# Patient Record
Sex: Male | Born: 1940 | Race: White | Hispanic: No | Marital: Single | Smoking: Never smoker
Health system: Southern US, Community
[De-identification: ages and names within clinical notes are randomized; demographics above are authoritative.]

## PROBLEM LIST (undated history)

## (undated) DIAGNOSIS — E7800 Pure hypercholesterolemia, unspecified: Secondary | ICD-10-CM

## (undated) DIAGNOSIS — K769 Liver disease, unspecified: Secondary | ICD-10-CM

## (undated) DIAGNOSIS — I219 Acute myocardial infarction, unspecified: Secondary | ICD-10-CM

## (undated) DIAGNOSIS — S065X9A Traumatic subdural hemorrhage with loss of consciousness of unspecified duration, initial encounter: Secondary | ICD-10-CM

## (undated) DIAGNOSIS — I10 Essential (primary) hypertension: Secondary | ICD-10-CM

## (undated) DIAGNOSIS — S065XAA Traumatic subdural hemorrhage with loss of consciousness status unknown, initial encounter: Secondary | ICD-10-CM

## (undated) DIAGNOSIS — E119 Type 2 diabetes mellitus without complications: Secondary | ICD-10-CM

## (undated) HISTORY — PX: TONSILLECTOMY: SUR1361

---

## 2011-04-08 DIAGNOSIS — K769 Liver disease, unspecified: Secondary | ICD-10-CM | POA: Insufficient documentation

## 2014-02-16 ENCOUNTER — Emergency Department (HOSPITAL_COMMUNITY)
Admission: EM | Admit: 2014-02-16 | Discharge: 2014-02-16 | Disposition: A | Discharge status: Home / Self Care | Payer: 59 | Source: Home / Self Care | Attending: Family Medicine | Admitting: Family Medicine

## 2014-02-16 ENCOUNTER — Encounter (HOSPITAL_COMMUNITY): Payer: Self-pay | Admitting: *Deleted

## 2014-02-16 DIAGNOSIS — E139 Other specified diabetes mellitus without complications: Secondary | ICD-10-CM | POA: Diagnosis not present

## 2014-02-16 DIAGNOSIS — I10 Essential (primary) hypertension: Secondary | ICD-10-CM | POA: Diagnosis not present

## 2014-02-16 HISTORY — DX: Essential (primary) hypertension: I10

## 2014-02-16 HISTORY — DX: Liver disease, unspecified: K76.9

## 2014-02-16 HISTORY — DX: Pure hypercholesterolemia, unspecified: E78.00

## 2014-02-16 HISTORY — DX: Acute myocardial infarction, unspecified: I21.9

## 2014-02-16 HISTORY — DX: Type 2 diabetes mellitus without complications: E11.9

## 2014-02-16 LAB — POCT I-STAT, CHEM 8
BUN: 14 mg/dL (ref 6–23)
Calcium, Ion: 1.09 mmol/L — ABNORMAL LOW (ref 1.13–1.30)
Chloride: 94 mmol/L — ABNORMAL LOW (ref 96–112)
Creatinine, Ser: 1.1 mg/dL (ref 0.50–1.35)
Glucose, Bld: 323 mg/dL — ABNORMAL HIGH (ref 70–99)
HCT: 52 % (ref 39.0–52.0)
Hemoglobin: 17.7 g/dL — ABNORMAL HIGH (ref 13.0–17.0)
Potassium: 4.5 mmol/L (ref 3.5–5.1)
Sodium: 135 mmol/L (ref 135–145)
TCO2: 24 mmol/L (ref 0–100)

## 2014-02-16 MED ORDER — INSULIN ASPART 100 UNIT/ML ~~LOC~~ SOLN
25.0000 [IU] | Freq: Once | SUBCUTANEOUS | Status: AC
  Administered 2014-02-16: 25 [IU] via SUBCUTANEOUS

## 2014-02-16 MED ORDER — INSULIN ASPART 100 UNIT/ML ~~LOC~~ SOLN
SUBCUTANEOUS | Status: AC
  Filled 2014-02-16: qty 1

## 2014-02-16 NOTE — ED Notes (Signed)
Pt   Recently    Moved  To  [place]    To  Live  With   His   Brother       Due  To     Inability    To  Take  Care  Of  Himself  At  His  Prior     Residence          He  Has  No  Dr  In  [place]     But     His  Family  Is   Getting        Him  A  Doctor  Lined  Up

## 2014-02-16 NOTE — ED Provider Notes (Signed)
CSN: [id number]     Arrival date & time 02/16/14  [id number] History   First MD Initiated Contact with Patient 02/16/14 1010     Chief Complaint  Patient presents with  . Diabetes   (Consider location/radiation/quality/duration/timing/severity/associated sxs/prior Treatment) Patient is a 74 y.o. male presenting with diabetes problem. The history is provided by the patient.  Diabetes This is a chronic problem. The current episode started more than 2 days ago (here for eval, brought by brother from [place], living without heat, food, no money in trailer, not taking meds, losing wt, passing out). The problem has not changed since onset.Pertinent negatives include no chest pain and no abdominal pain.    Past Medical History  Diagnosis Date  . Diabetes mellitus without complication   . Liver damage   . Heart attack   . Hypertension   . Hypercholesteremia    Past Surgical History  Procedure Laterality Date  . Tonsillectomy     History reviewed. No pertinent family history. History  Substance Use Topics  . Smoking status: Never Smoker   . Smokeless tobacco: Not on file  . Alcohol Use: No    Review of Systems  Constitutional: Positive for appetite change and unexpected weight change. Negative for fever.  Respiratory: Negative.   Cardiovascular: Negative for chest pain and leg swelling.  Gastrointestinal: Negative.  Negative for abdominal pain.    Allergies  Review of patient's allergies indicates no known allergies.  Home Medications   Prior to Admission medications   Medication Sig Start Date End Date Taking? Authorizing Provider  atorvastatin (LIPITOR) 20 MG tablet Take 20 mg by mouth daily.   Yes Historical Provider, MD  fosinopril (MONOPRIL) 10 MG tablet Take 10 mg by mouth daily.   Yes Historical Provider, MD  insulin lispro (HUMALOG) 100 UNIT/ML injection Inject into the skin 3 (three) times daily before meals.   Yes Historical Provider, MD  metFORMIN (GLUCOPHAGE) 500 MG  tablet Take by mouth 2 (two) times daily with a meal.   Yes Historical Provider, MD   BP 130/72 mmHg  Pulse 72  Temp(Src) 98.6 F (37 C) (Oral)  Resp 18  SpO2 100% Physical Exam  Constitutional: He is oriented to person, place, and time. He appears well-developed and well-nourished.  Neck: Normal range of motion. Neck supple.  Cardiovascular: Normal rate, regular rhythm, normal heart sounds and intact distal pulses.   Pulmonary/Chest: Effort normal and breath sounds normal.  Abdominal: Soft. Bowel sounds are normal. There is no tenderness.  Lymphadenopathy:    He has no cervical adenopathy.  Neurological: He is alert and oriented to person, place, and time.  Skin: Skin is warm and dry.  Nursing note and vitals reviewed.   ED Course  Procedures (including critical care time) Labs Review Labs Reviewed  POCT I-STAT, CHEM 8 - Abnormal; Notable for the following:    Chloride 94 (*)    Glucose, Bld 323 (*)    Calcium, Ion 1.09 (*)    Hemoglobin 17.7 (*)    All other components within normal limits   i-stat bs 323. Imaging Review No results found.   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 Essential hypertension        Linna HoffJames D Athea Haley, MD 02/16/14 1047

## 2014-02-16 NOTE — Discharge Instructions (Signed)
Take 25 units of humalog for sugar over 250.Marland Kitchen. Continue your other medicines and see your doctor soon to establish care .

## 2014-02-19 ENCOUNTER — Emergency Department (HOSPITAL_COMMUNITY)
Admission: EM | Admit: 2014-02-19 | Discharge: 2014-02-20 | Disposition: A | Discharge status: Home / Self Care | Payer: Medicare PPO | Attending: Emergency Medicine | Admitting: Emergency Medicine

## 2014-02-19 ENCOUNTER — Encounter (HOSPITAL_COMMUNITY): Payer: Self-pay | Admitting: *Deleted

## 2014-02-19 DIAGNOSIS — E11649 Type 2 diabetes mellitus with hypoglycemia without coma: Secondary | ICD-10-CM | POA: Diagnosis present

## 2014-02-19 DIAGNOSIS — Z794 Long term (current) use of insulin: Secondary | ICD-10-CM | POA: Insufficient documentation

## 2014-02-19 DIAGNOSIS — E780 Pure hypercholesterolemia: Secondary | ICD-10-CM | POA: Diagnosis not present

## 2014-02-19 DIAGNOSIS — Z8719 Personal history of other diseases of the digestive system: Secondary | ICD-10-CM | POA: Diagnosis not present

## 2014-02-19 DIAGNOSIS — I252 Old myocardial infarction: Secondary | ICD-10-CM | POA: Insufficient documentation

## 2014-02-19 DIAGNOSIS — Z79899 Other long term (current) drug therapy: Secondary | ICD-10-CM | POA: Diagnosis not present

## 2014-02-19 DIAGNOSIS — Z880 Allergy status to penicillin: Secondary | ICD-10-CM | POA: Diagnosis not present

## 2014-02-19 DIAGNOSIS — E162 Hypoglycemia, unspecified: Secondary | ICD-10-CM

## 2014-02-19 DIAGNOSIS — I10 Essential (primary) hypertension: Secondary | ICD-10-CM | POA: Insufficient documentation

## 2014-02-19 LAB — I-STAT CHEM 8, ED
BUN: 25 mg/dL — AB (ref 6–23)
CALCIUM ION: 1.19 mmol/L (ref 1.13–1.30)
Chloride: 101 mmol/L (ref 96–112)
Creatinine, Ser: 1 mg/dL (ref 0.50–1.35)
Glucose, Bld: 89 mg/dL (ref 70–99)
HCT: 50 % (ref 39.0–52.0)
HEMOGLOBIN: 17 g/dL (ref 13.0–17.0)
Potassium: 4.5 mmol/L (ref 3.5–5.1)
SODIUM: 139 mmol/L (ref 135–145)
TCO2: 25 mmol/L (ref 0–100)

## 2014-02-19 LAB — CBG MONITORING, ED
GLUCOSE-CAPILLARY: 86 mg/dL (ref 70–99)
Glucose-Capillary: 90 mg/dL (ref 70–99)

## 2014-02-19 NOTE — Progress Notes (Signed)
CSW was notified by Nurse CM that pt's family was interested in ALF information. CSW gave family a list of assisted living facilities.  Trish MageBrittney Genever Hentges, LCSWA [phone] ED CSW 10:56 PM 02/19/2014

## 2014-02-19 NOTE — ED Notes (Signed)
Per [facility] - pt from home w/ acute onset of slurred speech approx 1930, fire department reports CBG of 48 on scene, pt given instant glucose and a sandwich and repeat CBG 69. [facility] reports a negative stroke screen on assessment - family reports slurred speech and symptoms appear to have resolved. Pt lives w/ his brother and brother's wife.

## 2014-02-19 NOTE — ED Notes (Signed)
Bed: [id number] Expected date: 02/19/14 Expected time: 7:51 PM Means of arrival: Ambulance Comments: hypoglycemia

## 2014-02-19 NOTE — Progress Notes (Signed)
  CARE MANAGEMENT ED NOTE 02/19/2014  Patient:  Morey HummingbirdDONNELLY,[place]   Account Number:  [id number]  Date Initiated:  02/19/2014  Documentation initiated by:  Radford PaxFERRERO,Donnavan Covault  Subjective/Objective Assessment:   Patient presents to Ed with acute onset of slurred speech, hypoglycemia     Subjective/Objective Assessment Detail:   Patient with pmhx of DM, HTN, heart attack, high cholesterol.     Action/Plan:   Action/Plan Detail:   Anticipated DC Date:       Status Recommendation to Physician:   Result of Recommendation:    Other ED Services  Consult Working Plan   In-house referral  Clinical Social Worker   DC Associate Professorlanning Services  Other  PCP issues    Choice offered to / List presented to:            Status of service:  Completed, signed off  ED Comments:   ED Comments Detail:  EDCM spoke to patient and patient's sister in law Dede QueryMarjorie Balderston at bedside via telephone.  Patient currently living with his brother and his sister in law in [place].  Patient moved to live with his family from [place] as he was unable to care for himself there, no heat, food, money, patient was noted to be losing weight and passing out.  Per patient's sister in law Ollen GrossMarjorie, they have a pcp for the patient here in [place], patient just cannot see him right now due to insurance reasons.  They are waiting for his insurance card to come in the mail. They have set up an apppointment with this new doctor for next week for the patient.  Per Ollen GrossMarjorie, they feel the patient needs to be in a facility so that he can recieve his medications correctly and be cared for 24/7.  Ollen GrossMarjorie is aware that patient needs Medicaid insurance to enter into a ALF and are in the process of contacting DSS to apply for Medicaid.   EDCM informed patient's sister in law if they  felt that patient needed home health services, the patient's pcp can arrange this with a social worker to assist with placement.  EDCM also informed Ollen GrossMarjorie that  patient's pcp can assist with placement as well.  Ollen GrossMarjorie agreeable to speak to EDSW.  [facility] to speak to patient and family.  Ollen GrossMarjorie reports she feels patient has enough medication "pills" to last but feels insulin is going to run out soon.  Ollen GrossMarjorie reports she has a print out of all of patient's medications including insulin but is unsure of how much the patient takes.  EDCM received phone number for [facility] [place], however, pharmacy was closed.  EDCM discussed medication concerns with EDRN and EDP.  No further EDCM needs at this time.

## 2014-02-20 MED ORDER — INSULIN LISPRO 100 UNIT/ML ~~LOC~~ SOLN: 25.0000 [IU] | SUBCUTANEOUS | Status: DC

## 2014-02-20 MED ORDER — GLUCOSE BLOOD VI STRP: ORAL_STRIP | Status: AC

## 2014-02-20 MED ORDER — DEXTROSE (DIABETIC USE) 40 % PO GEL: 15.0000 g | Freq: Once | ORAL | Status: DC

## 2014-02-20 NOTE — Discharge Instructions (Signed)
Low Blood Sugar °Low blood sugar (hypoglycemia) means that the level of sugar in your blood is lower than it should be. Signs of low blood sugar include: °· Getting sweaty. °· Feeling hungry. °· Feeling dizzy or weak. °· Feeling sleepier than normal. °· Feeling nervous. °· Headaches. °· Having a fast heartbeat. °Low blood sugar can happen fast and can be an emergency. Your doctor can do tests to check your blood sugar level. You can have low blood sugar and not have diabetes. °HOME CARE °· Check your blood sugar as told by your doctor. If it is less than 70 mg/dl or as told by your doctor, take 1 of the following: °¨ 3 to 4 glucose tablets. °¨ ½ cup clear juice. °¨ ½ cup soda pop, not diet. °¨ 1 cup milk. °¨ 5 to 6 hard candies. °· Recheck blood sugar after 15 minutes. Repeat until it is at the right level. °· Eat a snack if it is more than 1 hour until the next meal. °· Only take medicine as told by your doctor. °· Do not skip meals. Eat on time. °· Do not drink alcohol except with meals. °· Check your blood glucose before driving. °· Check your blood glucose before and after exercise. °· Always carry treatment with you, such as glucose pills. °· Always wear a medical alert bracelet if you have diabetes. °GET HELP RIGHT AWAY IF:  °· Your blood glucose goes below 70 mg/dl or as told by your doctor, and you: °¨ Are confused. °¨ Are not able to swallow. °¨ Pass out (faint). °· You cannot treat yourself. You may need someone to help you. °· You have low blood sugar problems often. °· You have problems from your medicines. °· You are not feeling better after 3 to 4 days. °· You have vision changes. °MAKE SURE YOU:  °· Understand these instructions. °· Will watch this condition. °· Will get help right away if you are not doing well or get worse. °Document Released: 03/18/2009 Document Revised: 03/16/2011 Document Reviewed: 03/18/2009 °ExitCare® Patient Information ©2015 [employer]. This information is not intended to  replace advice given to you by your health care provider. Make sure you discuss any questions you have with your health care provider. ° °

## 2014-03-05 NOTE — ED Provider Notes (Signed)
CSN: [id number]     Arrival date & time 02/19/14  2004 History   First MD Initiated Contact with Patient 02/19/14 2046     Chief Complaint  Patient presents with  . Hypoglycemia     (Consider location/radiation/quality/duration/timing/severity/associated sxs/prior Treatment) Patient is a 74 y.o. male presenting with hypoglycemia. The history is provided by the patient and the EMS personnel. No language interpreter was used.  Hypoglycemia Initial blood sugar:  48 Blood sugar after intervention:  69 Severity:  Severe Onset quality:  Unable to specify Timing:  Unable to specify Progression:  Resolved Chronicity:  Recurrent Diabetic status:  Controlled with insulin and controlled with oral medications Current diabetic therapy:  Lispro, metformin Context: decreased oral intake   Context comment:  Increased med use Relieved by:  Nothing Ineffective treatments:  None tried Associated symptoms: no dizziness, no shortness of breath, no speech difficulty, no vomiting and no weakness   Associated symptoms comment:  Slurred speech   Past Medical History  Diagnosis Date  . Diabetes mellitus without complication   . Liver damage   . Heart attack   . Hypertension   . Hypercholesteremia    Past Surgical History  Procedure Laterality Date  . Tonsillectomy     History reviewed. No pertinent family history. History  Substance Use Topics  . Smoking status: Never Smoker   . Smokeless tobacco: Not on file  . Alcohol Use: No    Review of Systems  Constitutional: Negative for fever, activity change, appetite change and fatigue.  HENT: Negative for congestion, facial swelling, rhinorrhea and trouble swallowing.   Eyes: Negative for photophobia and pain.  Respiratory: Negative for cough, chest tightness and shortness of breath.   Cardiovascular: Negative for chest pain and leg swelling.  Gastrointestinal: Negative for nausea, vomiting, abdominal pain, diarrhea and constipation.   Endocrine: Negative for polydipsia and polyuria.  Genitourinary: Negative for dysuria, urgency, decreased urine volume and difficulty urinating.  Musculoskeletal: Negative for back pain and gait problem.  Skin: Negative for color change, rash and wound.  Allergic/Immunologic: Negative for immunocompromised state.  Neurological: Negative for dizziness, facial asymmetry, speech difficulty, weakness, numbness and headaches.  Psychiatric/Behavioral: Negative for confusion, decreased concentration and agitation.      Allergies  Penicillins  Home Medications   Prior to Admission medications   Medication Sig Start Date End Date Taking? Authorizing Provider  atorvastatin (LIPITOR) 20 MG tablet Take 20 mg by mouth daily.   Yes Historical Provider, MD  fosinopril (MONOPRIL) 10 MG tablet Take 10 mg by mouth daily.   Yes Historical Provider, MD  metFORMIN (GLUCOPHAGE) 500 MG tablet Take by mouth 2 (two) times daily with a meal.   Yes Historical Provider, MD  dextrose (GLUTOSE) 40 % gel Take 15 g by mouth once. 02/20/14   Toy Cookey, MD  glucose blood (FREESTYLE TEST STRIPS) test strip Use as instructed 02/20/14   Toy Cookey, MD  insulin lispro (HUMALOG) 100 UNIT/ML injection Inject 0.25 mLs (25 Units total) into the skin every morning. 02/20/14   Toy Cookey, MD   BP 118/72 mmHg  Pulse 80  Temp(Src) 97.5 F (36.4 C) (Oral)  Resp 16  SpO2 100% Physical Exam  Constitutional: He is oriented to person, place, and time. He appears well-developed and well-nourished. No distress.  HENT:  Head: Normocephalic and atraumatic.  Mouth/Throat: No oropharyngeal exudate.  Eyes: Pupils are equal, round, and reactive to light.  Neck: Normal range of motion. Neck supple.  Cardiovascular: Normal rate, regular rhythm and  normal heart sounds.  Exam reveals no gallop and no friction rub.   No murmur heard. Pulmonary/Chest: Effort normal and breath sounds normal. No respiratory distress. He has no  wheezes. He has no rales.  Abdominal: Soft. Bowel sounds are normal. He exhibits no distension and no mass. There is no tenderness. There is no rebound and no guarding.  Musculoskeletal: Normal range of motion. He exhibits no edema or tenderness.  Neurological: He is alert and oriented to person, place, and time.  Skin: Skin is warm and dry.  Psychiatric: He has a normal mood and affect.    ED Course  Procedures (including critical care time) Labs Review Labs Reviewed  I-STAT CHEM 8, ED - Abnormal; Notable for the following:    BUN 25 (*)    All other components within normal limits  CBG MONITORING, ED  CBG MONITORING, ED  CBG MONITORING, ED    Imaging Review No results found.   EKG Interpretation None      MDM   Final diagnoses:  Hypoglycemia    Pt is a 74 y.o. male with Pmhx as above who presents with report of slurred speech and hypoglycemia initially of 48, improved after instant glucose and a sandwich, slurred speech resolved. Hx of same. On PE, VSS, pt in NAD, no focal neuro findings. . Pt observed in the ED for several hours, was able to maintain Glu, is not on a long acting agent. Will dec his daily lispro dose to what he was using prior to moving in with family. They will continue to keep glu log and f/u with PCP who is being set up.       Toy CookeyMegan Docherty, MD 03/05/14 [phone]

## 2014-03-27 ENCOUNTER — Ambulatory Visit: Payer: Medicare PPO | Admitting: Podiatry

## 2014-04-13 ENCOUNTER — Ambulatory Visit (INDEPENDENT_AMBULATORY_CARE_PROVIDER_SITE_OTHER): Payer: Medicare PPO | Admitting: Podiatry

## 2014-04-13 ENCOUNTER — Encounter: Payer: Self-pay | Admitting: Podiatry

## 2014-04-13 VITALS — BP 125/77 | HR 81 | Resp 18

## 2014-04-13 DIAGNOSIS — B351 Tinea unguium: Secondary | ICD-10-CM

## 2014-04-13 DIAGNOSIS — M79676 Pain in unspecified toe(s): Secondary | ICD-10-CM | POA: Diagnosis not present

## 2014-04-13 NOTE — Progress Notes (Signed)
   Subjective:    Patient ID: Mitchell Scott, male    DOB: 12/15/1940, 74 y.o.   MRN: [id number]  HPI  74 year old male presents the office they with complete a painful, thick, elongated toenails which she is unable to trim himself. He denies any drainage or redness around the nail sites. He denies any numbness or tingling this feeding denies any history of ulceration. No other complaints at this time.  Review of Systems  All other systems reviewed and are negative.      Objective:   Physical Exam AAO 3, NAD DP/PT pulses palpable, CRT less than 3 seconds Protective sensation intact with Dorann OuSimms Weinstein Monofilament, Achilles tendon reflex intact. Nails are hypertrophic, dystrophic, elongated, brittle, discolored 10. There subjective tenderness on nails 1-5 bilaterally. No swelling erythema or drainage. No open lesions or pre-ulcerative lesions identified bilaterally. There is no other areas of tenderness to bilateral lower extremities. There is no edema, erythema, increase in warmth bilaterally. No pain with calf compression, swelling, warmth, erythema.      Assessment & Plan:  74 year old male with symptomatic onychomycosis -Nail sharply debrided 10 without complications to breast bleeding -Discussed importance of daily foot inspection. -Follow-up in 3 months or sooner if any problems are to arise. In the meantime encouraged call the office any questions or concerns.

## 2014-04-16 ENCOUNTER — Encounter: Payer: Self-pay | Admitting: Podiatry

## 2014-07-27 ENCOUNTER — Ambulatory Visit: Payer: Medicare PPO

## 2018-07-03 ENCOUNTER — Emergency Department (HOSPITAL_COMMUNITY): Payer: Medicare Other

## 2018-07-03 ENCOUNTER — Encounter (HOSPITAL_COMMUNITY): Payer: Self-pay

## 2018-07-03 ENCOUNTER — Inpatient Hospital Stay (HOSPITAL_COMMUNITY)
Admission: EM | Admit: 2018-07-03 | Discharge: 2018-07-08 | DRG: 082 | Disposition: A | Discharge status: Skilled Nursing Facility | Payer: Medicare Other | Source: Skilled Nursing Facility | Attending: Internal Medicine | Admitting: Internal Medicine

## 2018-07-03 ENCOUNTER — Other Ambulatory Visit: Payer: Self-pay

## 2018-07-03 DIAGNOSIS — Z794 Long term (current) use of insulin: Secondary | ICD-10-CM

## 2018-07-03 DIAGNOSIS — Z66 Do not resuscitate: Secondary | ICD-10-CM | POA: Diagnosis present

## 2018-07-03 DIAGNOSIS — G9341 Metabolic encephalopathy: Secondary | ICD-10-CM | POA: Diagnosis not present

## 2018-07-03 DIAGNOSIS — U071 COVID-19: Secondary | ICD-10-CM | POA: Diagnosis present

## 2018-07-03 DIAGNOSIS — R296 Repeated falls: Secondary | ICD-10-CM | POA: Diagnosis present

## 2018-07-03 DIAGNOSIS — F0390 Unspecified dementia without behavioral disturbance: Secondary | ICD-10-CM | POA: Diagnosis present

## 2018-07-03 DIAGNOSIS — Y92129 Unspecified place in nursing home as the place of occurrence of the external cause: Secondary | ICD-10-CM

## 2018-07-03 DIAGNOSIS — E7800 Pure hypercholesterolemia, unspecified: Secondary | ICD-10-CM | POA: Diagnosis present

## 2018-07-03 DIAGNOSIS — D72829 Elevated white blood cell count, unspecified: Secondary | ICD-10-CM | POA: Diagnosis present

## 2018-07-03 DIAGNOSIS — W19XXXA Unspecified fall, initial encounter: Secondary | ICD-10-CM | POA: Diagnosis not present

## 2018-07-03 DIAGNOSIS — E1165 Type 2 diabetes mellitus with hyperglycemia: Secondary | ICD-10-CM | POA: Diagnosis present

## 2018-07-03 DIAGNOSIS — S065X9A Traumatic subdural hemorrhage with loss of consciousness of unspecified duration, initial encounter: Principal | ICD-10-CM | POA: Diagnosis present

## 2018-07-03 DIAGNOSIS — W1830XA Fall on same level, unspecified, initial encounter: Secondary | ICD-10-CM | POA: Diagnosis present

## 2018-07-03 DIAGNOSIS — I252 Old myocardial infarction: Secondary | ICD-10-CM

## 2018-07-03 DIAGNOSIS — E119 Type 2 diabetes mellitus without complications: Secondary | ICD-10-CM | POA: Diagnosis not present

## 2018-07-03 DIAGNOSIS — I2510 Atherosclerotic heart disease of native coronary artery without angina pectoris: Secondary | ICD-10-CM | POA: Diagnosis present

## 2018-07-03 DIAGNOSIS — S065XAA Traumatic subdural hemorrhage with loss of consciousness status unknown, initial encounter: Secondary | ICD-10-CM | POA: Diagnosis present

## 2018-07-03 DIAGNOSIS — I10 Essential (primary) hypertension: Secondary | ICD-10-CM | POA: Diagnosis present

## 2018-07-03 HISTORY — DX: Traumatic subdural hemorrhage with loss of consciousness status unknown, initial encounter: S06.5XAA

## 2018-07-03 HISTORY — DX: Traumatic subdural hemorrhage with loss of consciousness of unspecified duration, initial encounter: S06.5X9A

## 2018-07-03 LAB — COMPREHENSIVE METABOLIC PANEL
ALT: 26 U/L (ref 0–44)
AST: 26 U/L (ref 15–41)
Albumin: 3.7 g/dL (ref 3.5–5.0)
Alkaline Phosphatase: 111 U/L (ref 38–126)
Anion gap: 12 (ref 5–15)
BUN: 10 mg/dL (ref 8–23)
CO2: 26 mmol/L (ref 22–32)
Calcium: 9 mg/dL (ref 8.9–10.3)
Chloride: 98 mmol/L (ref 98–111)
Creatinine, Ser: 1.04 mg/dL (ref 0.61–1.24)
GFR calc Af Amer: >60 mL/min (ref >60)
GFR calc non Af Amer: >60 mL/min (ref >60)
Glucose, Bld: 337 mg/dL — ABNORMAL HIGH (ref 70–99)
Potassium: 3.6 mmol/L (ref 3.5–5.1)
Sodium: 136 mmol/L (ref 135–145)
Total Bilirubin: 0.8 mg/dL (ref 0.3–1.2)
Total Protein: 7 g/dL (ref 6.5–8.1)

## 2018-07-03 LAB — URINALYSIS, ROUTINE W REFLEX MICROSCOPIC
Bilirubin Urine: NEGATIVE
Glucose, UA: >=500 mg/dL — AB
Hgb urine dipstick: NEGATIVE
Ketones, ur: 5 mg/dL — AB
Leukocytes,Ua: NEGATIVE
Nitrite: NEGATIVE
Protein, ur: NEGATIVE mg/dL
Specific Gravity, Urine: 1.023 (ref 1.005–1.030)
pH: 5 (ref 5.0–8.0)

## 2018-07-03 LAB — CBC WITH DIFFERENTIAL/PLATELET
Abs Immature Granulocytes: 0.04 10*3/uL (ref 0.00–0.07)
Basophils Absolute: 0.1 10*3/uL (ref 0.0–0.1)
Basophils Relative: 1 %
Eosinophils Absolute: 0.7 10*3/uL — ABNORMAL HIGH (ref 0.0–0.5)
Eosinophils Relative: 6 %
HCT: 41.6 % (ref 39.0–52.0)
Hemoglobin: 13.2 g/dL (ref 13.0–17.0)
Immature Granulocytes: 0 %
Lymphocytes Relative: 12 %
Lymphs Abs: 1.3 10*3/uL (ref 0.7–4.0)
MCH: 27 pg (ref 26.0–34.0)
MCHC: 31.7 g/dL (ref 30.0–36.0)
MCV: 85.2 fL (ref 80.0–100.0)
Monocytes Absolute: 1.1 10*3/uL — ABNORMAL HIGH (ref 0.1–1.0)
Monocytes Relative: 10 %
Neutro Abs: 8.1 10*3/uL — ABNORMAL HIGH (ref 1.7–7.7)
Neutrophils Relative %: 71 %
Platelets: 300 10*3/uL (ref 150–400)
RBC: 4.88 MIL/uL (ref 4.22–5.81)
RDW: 16.5 % — ABNORMAL HIGH (ref 11.5–15.5)
WBC: 11.4 10*3/uL — ABNORMAL HIGH (ref 4.0–10.5)
nRBC: 0 % (ref 0.0–0.2)

## 2018-07-03 LAB — CBG MONITORING, ED: Glucose-Capillary: 302 mg/dL — ABNORMAL HIGH (ref 70–99)

## 2018-07-03 LAB — SARS CORONAVIRUS 2 BY RT PCR (HOSPITAL ORDER, PERFORMED IN ~~LOC~~ HOSPITAL LAB): SARS Coronavirus 2: POSITIVE — AB

## 2018-07-03 MED ORDER — ONDANSETRON HCL 4 MG/2ML IJ SOLN: 4.0000 mg | Freq: Three times a day (TID) | INTRAMUSCULAR | Status: DC | PRN

## 2018-07-03 MED ORDER — HYDRALAZINE HCL 20 MG/ML IJ SOLN: 5.0000 mg | INTRAMUSCULAR | Status: DC | PRN

## 2018-07-03 MED ORDER — LEVALBUTEROL TARTRATE 45 MCG/ACT IN AERO
2.0000 | INHALATION_SPRAY | Freq: Four times a day (QID) | RESPIRATORY_TRACT | Status: DC | PRN
  Filled 2018-07-03: qty 15

## 2018-07-03 NOTE — ED Notes (Signed)
Attempted to call report

## 2018-07-03 NOTE — Consult Note (Signed)
Chief Complaint   Chief Complaint  Patient presents with  . Fall    HPI   Consult requested by: Dr Lockie Mola Reason for consult: acute on chronic SDH  HPI: Mahmud Gehm is a 78 y.o. 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 He underwent repeat head CT which showed slightly enlargement of known SDH. NSY consultation requested.  Patient is confused and unable to provide history. By report, at baseline he is oriented x3 and dependent for most ADLs. He is not on any blood thinning agents.  I am able to view imaging from prior ED visits at [facility] 06/05/2018 and 06/12/2018. SDH is evident on these films. Unable to review any notes from his prior hospital stay.   There are no active problems to display for this patient.  PMH: Past Medical History:  Diagnosis Date  . Diabetes mellitus without complication (HCC)   . Heart attack (HCC)   . Hypercholesteremia   . Hypertension   . Liver damage     PSH: Past Surgical History:  Procedure Laterality Date  . TONSILLECTOMY      (Not in a hospital admission)   SH: Social History   Tobacco Use  . Smoking status: Never Smoker  Substance Use Topics  . Alcohol use: No  . Drug use: No    MEDS: Prior to Admission medications   Medication Sig Start Date End Date Taking? Authorizing Provider  atorvastatin (LIPITOR) 20 MG tablet Take 20 mg by mouth daily.    [provider]  atorvastatin (LIPITOR) 20 MG tablet Take 20 mg by mouth. 12/15/13 12/15/14  [provider]  Blood Glucose Monitoring Suppl (ONE TOUCH ULTRA MINI) W/DEVICE KIT Frequency:   Dosage:NULL   KIT  Instructions:Use to check BS daily.  Note:Dx:250.00   Insulin Treated 02/01/12   [provider]  dextrose (GLUTOSE) 40 % gel Take 15 g by mouth once. 02/20/14   Toy Cookey, MD  fosinopril (MONOPRIL) 10 MG tablet Take 10 mg by  mouth daily.    [provider]  fosinopril (MONOPRIL) 10 MG tablet Take 10 mg by mouth. 12/15/13 12/15/14  [provider]  glucose blood (FREESTYLE TEST STRIPS) test strip Use as instructed 02/20/14   Toy Cookey, MD  glucose blood test strip Check BS daily. Dx: E11.9 03/07/14   [provider]  INS SYRINGE/NEEDLE 1CC/28G 28G X 1/2" 1 ML MISC 31 gage  To use with insulin injections daily. 03/27/13   [provider]  insulin lispro (HUMALOG) 100 UNIT/ML injection Inject 0.25 mLs (25 Units total) into the skin every morning. 02/20/14   Toy Cookey, MD  Insulin Syringe-Needle U-100 31G X 5/16" 1 ML MISC Use BID for Novolin injections and PRN SS  Novolog 10/16/13   [provider]  lidocaine (XYLOCAINE) 2 % jelly  03/16/14   [provider]  metFORMIN (GLUCOPHAGE) 500 MG tablet Take by mouth 2 (two) times daily with a meal.    [provider]  metFORMIN (GLUCOPHAGE) 500 MG tablet Take 500 mg by mouth. 12/15/13 12/15/14  [provider]  NOVOFINE PLUS 32G X 4 MM MISC See admin instructions. 03/21/14   [provider]  ReliOn Ultra Thin Lancets MISC Use to check BS three times daily. 03/03/10   [provider]    ALLERGY: Allergies  Allergen Reactions  . Codeine  Other reaction(s): GASTRIC UPSET  . Penicillins Itching and Rash    Other reaction(s): UNKNOWN    Social History   Tobacco Use  . Smoking status: Never Smoker  Substance Use Topics  . Alcohol use: No     History reviewed. No pertinent family history.   ROS   ROS altered, unable to obtain  Exam   Vitals:   07/03/18 1845 07/03/18 1915  BP: (!) 143/83 139/75  Pulse: 86 85  Resp: 19 11  Temp:    SpO2: 99% 99%   General appearance: elderly male, resting comfortably, left frontal hematoma GCS: 13 - E4V3M6 Eyes: No scleral injection Cardiovascular: Regular rate and rhythm without murmurs, rubs, gallops. No edema or  variciosities. Distal pulses normal. Pulmonary: Effort normal, non-labored breathing Musculoskeletal:     Muscle tone upper extremities: Normal    Muscle tone lower extremities: Normal    Motor exam: Upper Extremities Deltoid Bicep Tricep Grip  Right 5/5 5/5 5/5 5/5  Left 5/5 5/5 5/5 5/5   Lower Extremity IP Quad PF DF EHL  Right 5/5 5/5 5/5 5/5 5/5  Left 5/5 5/5 5/5 5/5 5/5   Neurological Mental Status:    - Patient is awake, oriented to self, DOB, location (hospital but not specifically [place]). No oriented to year (says 1920).    - Patient is unable to give a clear and coherent history.    - Follows commands. Difficulties following two step commands Cranial Nerves    - II: Visual Fields are full. PERRL    - III/IV/VI: EOMI without ptosis or diploplia.     - V: Facial sensation is grossly normal    - VII: Facial movement is symmetric.     - VIII: hearing is intact to voice    - X: Uvula elevates symmetrically    - XI: Shoulder shrug is symmetric.    - XII: tongue is midline without atrophy or fasciculations.  Sensory: Sensation grossly intact to LT No drift  Results - Imaging/Labs   Results for orders placed or performed during the hospital encounter of 07/03/18 (from the past 48 hour(s))  CBG monitoring, ED     Status: Abnormal   Collection Time: 07/03/18  5:58 PM  Result Value Ref Range   Glucose-Capillary 302 (H) 70 - 99 mg/dL   Comment 1 Notify RN    Comment 2 Document in Chart   CBC with Differential     Status: Abnormal   Collection Time: 07/03/18  6:00 PM  Result Value Ref Range   WBC 11.4 (H) 4.0 - 10.5 K/uL   RBC 4.88 4.22 - 5.81 MIL/uL   Hemoglobin 13.2 13.0 - 17.0 g/dL   HCT 16.1 09.6 - 04.5 %   MCV 85.2 80.0 - 100.0 fL   MCH 27.0 26.0 - 34.0 pg   MCHC 31.7 30.0 - 36.0 g/dL   RDW 40.9 (H) 81.1 - 91.4 %   Platelets 300 150 - 400 K/uL   nRBC 0.0 0.0 - 0.2 %   Neutrophils Relative % 71 %   Neutro Abs 8.1 (H) 1.7 - 7.7 K/uL   Lymphocytes Relative 12 %    Lymphs Abs 1.3 0.7 - 4.0 K/uL   Monocytes Relative 10 %   Monocytes Absolute 1.1 (H) 0.1 - 1.0 K/uL   Eosinophils Relative 6 %   Eosinophils Absolute 0.7 (H) 0.0 - 0.5 K/uL   Basophils Relative 1 %   Basophils Absolute 0.1 0.0 - 0.1 K/uL   Immature Granulocytes  0 %   Abs Immature Granulocytes 0.04 0.00 - 0.07 K/uL    Comment: Performed at [facility], [address] [place], [place] [address]  Comprehensive metabolic panel     Status: Abnormal   Collection Time: 07/03/18  6:00 PM  Result Value Ref Range   Sodium 136 135 - 145 mmol/L   Potassium 3.6 3.5 - 5.1 mmol/L   Chloride 98 98 - 111 mmol/L   CO2 26 22 - 32 mmol/L   Glucose, Bld 337 (H) 70 - 99 mg/dL   BUN 10 8 - 23 mg/dL   Creatinine, Ser 0.16 0.61 - 1.24 mg/dL   Calcium 9.0 8.9 - 01.0 mg/dL   Total Protein 7.0 6.5 - 8.1 g/dL   Albumin 3.7 3.5 - 5.0 g/dL   AST 26 15 - 41 U/L   ALT 26 0 - 44 U/L   Alkaline Phosphatase 111 38 - 126 U/L   Total Bilirubin 0.8 0.3 - 1.2 mg/dL   GFR calc non Af Amer >60 >60 mL/min   GFR calc Af Amer >60 >60 mL/min   Anion gap 12 5 - 15    Comment: Performed at [facility], [address] [place], [place] [address]  Urinalysis, Routine w reflex microscopic     Status: Abnormal   Collection Time: 07/03/18  7:24 PM  Result Value Ref Range   Color, Urine YELLOW YELLOW   APPearance HAZY (A) CLEAR   Specific Gravity, Urine 1.023 1.005 - 1.030   pH 5.0 5.0 - 8.0   Glucose, UA >=500 (A) NEGATIVE mg/dL   Hgb urine dipstick NEGATIVE NEGATIVE   Bilirubin Urine NEGATIVE NEGATIVE   Ketones, ur 5 (A) NEGATIVE mg/dL   Protein, ur NEGATIVE NEGATIVE mg/dL   Nitrite NEGATIVE NEGATIVE   Leukocytes,Ua NEGATIVE NEGATIVE   RBC / HPF 0-5 0 - 5 RBC/hpf   WBC, UA 0-5 0 - 5 WBC/hpf   Bacteria, UA RARE (A) NONE SEEN   Squamous Epithelial / LPF 0-5 0 - 5   Mucus PRESENT    Ca Oxalate Crys, UA PRESENT     Comment: Performed at [facility], [address] [place], [place] [address]     Ct Head Wo Contrast  Result Date: 07/03/2018 CLINICAL DATA:  Multiple falls, recent stroke EXAM: CT HEAD WITHOUT CONTRAST CT CERVICAL SPINE WITHOUT CONTRAST TECHNIQUE: Multidetector CT imaging of the head and cervical spine was performed following the standard protocol without intravenous contrast. Multiplanar CT image reconstructions of the cervical spine were also generated. COMPARISON:  06/12/2018, 06/05/2018 FINDINGS: CT HEAD FINDINGS Brain: There is a redemonstrated left hemispheric subdural hematoma, which has slightly enlarged compared to prior examination dated 06/12/2018, with a new, high attenuation internal components. This now measures approximately 8 mm in maximum coronal thickness, previously 6 mm. There is no significant change in approximately 3 mm left right midline shift. Underlying small-vessel white matter disease and global volume loss. Vascular: No hyperdense vessel or unexpected calcification. Skull: Normal. Negative for fracture or focal lesion. Sinuses/Orbits: No acute finding. Other: Soft tissue contusion of the left frontal scalp. CT CERVICAL SPINE FINDINGS Alignment: Normal. Skull base and vertebrae: No acute fracture. No primary bone lesion or focal pathologic process. Soft tissues and spinal canal: No prevertebral fluid or swelling. No visible canal hematoma. 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 Upper chest: Negative. Other: None. IMPRESSION: 1. There is a redemonstrated left hemispheric  subdural hematoma, which has slightly enlarged compared to prior examination dated 06/12/2018, with a new, high attenuation internal components. This now measures approximately 8 mm in maximum coronal thickness, previously 6 mm. There is no significant change in approximately 3 mm left right midline shift. Underlying small-vessel white matter disease and global volume loss. 2.  Soft tissue contusion of the left frontal scalp.  3. No fracture or static subluxation of the cervical spine. 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 Electronically Signed   By: Lauralyn Primes M.D.   On: 07/03/2018 18:30   Ct Cervical Spine Wo Contrast  Result Date: 07/03/2018 CLINICAL DATA:  Multiple falls, recent stroke EXAM: CT HEAD WITHOUT CONTRAST CT CERVICAL SPINE WITHOUT CONTRAST TECHNIQUE: Multidetector CT imaging of the head and cervical spine was performed following the standard protocol without intravenous contrast. Multiplanar CT image reconstructions of the cervical spine were also generated. COMPARISON:  06/12/2018, 06/05/2018 FINDINGS: CT HEAD FINDINGS Brain: There is a redemonstrated left hemispheric subdural hematoma, which has slightly enlarged compared to prior examination dated 06/12/2018, with a new, high attenuation internal components. This now measures approximately 8 mm in maximum coronal thickness, previously 6 mm. There is no significant change in approximately 3 mm left right midline shift. Underlying small-vessel white matter disease and global volume loss. Vascular: No hyperdense vessel or unexpected calcification. Skull: Normal. Negative for fracture or focal lesion. Sinuses/Orbits: No acute finding. Other: Soft tissue contusion of the left frontal scalp. CT CERVICAL SPINE FINDINGS Alignment: Normal. Skull base and vertebrae: No acute fracture. No primary bone lesion or focal pathologic process. Soft tissues and spinal canal: No prevertebral fluid or swelling. No visible canal hematoma. 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 Upper chest: Negative. Other: None. IMPRESSION: 1. There is a redemonstrated left hemispheric subdural hematoma, which has slightly enlarged compared to prior examination dated 06/12/2018, with a new, high attenuation internal components. This now measures approximately 8 mm in maximum coronal  thickness, previously 6 mm. There is no significant change in approximately 3 mm left right midline shift. Underlying small-vessel white matter disease and global volume loss. 2.  Soft tissue contusion of the left frontal scalp. 3. No fracture or static subluxation of the cervical spine. 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 Electronically Signed   By: Lauralyn Primes M.D.   On: 07/03/2018 18:30   Dg Chest Portable 1 View  Result Date: 07/03/2018 CLINICAL DATA:  Fall EXAM: PORTABLE CHEST 1 VIEW COMPARISON:  None. FINDINGS: Cardiomegaly. Lungs clear. No effusions or edema. No acute bony abnormality. IMPRESSION: Cardiomegaly.  No active disease. Electronically Signed   By: Charlett Nose M.D.   On: 07/03/2018 19:30   Impression/Plan   78 y.o. male with known left hemispheric SDH without significant MLS who [facility]. He underwent repeat head CT which shows slight enlargement of SDH with acute component. No significant MLS. Although confused, he follows commands and has grossly normal strength in extremities. No indication for NS intervention.   Reviewed with Dr Newell Coral. Given history of known SDH, he has likely been followed by neurosurgeon in [place]. Would rec outpt follow up with that neurosurgeon.  He will be admitted under [facility] for observation.  - Rec repeat head CT tomorrow afternoon for monitoring - Neuro check q 2 hour. Report any change  Cindra Presume, [facility]

## 2018-07-03 NOTE — ED Notes (Signed)
Pt constantly pulling out iv and cords. No recollection of anything occuring.

## 2018-07-03 NOTE — ED Triage Notes (Addendum)
Pt arrived to ed via [facility] after a second fall today. First fall at approximately 1400 today with second fall at 1700. After second fall pt unable to follow commands. Pt baseline is AOx4. EMS reports GCS of 10.

## 2018-07-03 NOTE — ED Notes (Signed)
Pt's CBG result was 302. Informed Deneise Lever - RN.

## 2018-07-03 NOTE — H&P (Addendum)
History and Physical    Mitchell Scott NPY:[id number] DOB: 06/06/1940 DOA: 07/03/2018  Referring MD/NP/PA:   PCP: System, Pcp Not In   Patient coming from:  The patient is coming from SNF.  At baseline, pt is dependent for most of ADL.        Chief Complaint: fall, head injury, AMS  HPI: Mitchell Scott is a 78 y.o. male with medical history significant of recent SDH (02/2018), hypertension, hyperlipidemia, diabetes mellitus, CAD, who presents with fall, head injury and AMS.  Patient has AMS, not following command and is unable to provide accurate medical history, therefore, most of the history is obtained by discussing the case with ED physician, per EMS report, and with the nursing staff.  Per report, pt fell twice today, at 14:00 and 17:00. Not sure exactly what happened. Pt had head injury with left forehead hematoma.  He is confused, not following command.  He is reportedly oriented x3 at baseline.  Patient moves all extremities, no facial droop was or slurred speech.  No active cough, respiratory distress, nausea vomiting or diarrhea noted.  Not sure if patient has any chest pain or symptoms of UTI.  ED Course: pt was found to have WBC 11.4, negative urinalysis, pending COVID-19 test, electrolytes renal function okay, temperature 97.2, blood pressure 139/75, heart rate 103, 85, no tachypnea, oxygen saturation 97-99% on room air.  Chest x-ray showed cardiomegaly without infiltration.  CT of C-spine is negative for bony fracture, but showed a degenerative disc disease. CT-head showed left SDH which is slightly enlarged compared to recent image 6/7, no significant change in approximately 3 mm left right midline shift.  Patient is placed on stepdown bed for observation. Neurosurgeon, Dr. Sherwood Gambler was consulted.  # CT-head: There is a redemonstrated left hemispheric subdural hematoma, which has slightly enlarged compared to prior examination dated 06/12/2018, with a new, high attenuation internal  components. This now measures approximately 8 mm in maximum coronal thickness, previously 6 mm. There is no significant change in approximately 3 mm left right midline shift. Underlying small-vessel white matter disease and global volume loss.  Review of Systems: Could not reviewed due to altered mental status.  Allergy:  Allergies  Allergen Reactions   Codeine     Other reaction(s): GASTRIC UPSET   Penicillins Itching and Rash    Other reaction(s): UNKNOWN    Past Medical History:  Diagnosis Date   Diabetes mellitus without complication ([place])    Heart attack ([place])    Hypercholesteremia    Hypertension    Liver damage    SDH (subdural hematoma) (H[facility])     Past Surgical History:  Procedure Laterality Date   TONSILLECTOMY      Social History:  reports that he has never smoked. He does not have any smokeless tobacco history on file. He reports that he does not drink alcohol or use drugs.  Family History: could not be reviewed due to altered mental status.  Prior to Admission medications   Medication Sig Start Date End Date Taking? Authorizing Provider  atorvastatin (LIPITOR) 20 MG tablet Take 20 mg by mouth daily.    [provider]  atorvastatin (LIPITOR) 20 MG tablet Take 20 mg by mouth. 12/15/13 12/15/14  [provider]  Blood Glucose Monitoring Suppl (ONE TOUCH ULTRA MINI) W/DEVICE KIT Frequency:   Dosage:   KIT  Instructions:Use to check BS daily.  Note:Dx:250.00   Insulin Treated 02/01/12   [provider]  dextrose (GLUTOSE) 40 % gel Take 15  g by mouth once. 02/20/14   Ernestina Patches, MD  fosinopril (MONOPRIL) 10 MG tablet Take 10 mg by mouth daily.    [provider]  fosinopril (MONOPRIL) 10 MG tablet Take 10 mg by mouth. 12/15/13 12/15/14  [provider]  glucose blood (FREESTYLE TEST STRIPS) test strip Use as instructed 02/20/14   Ernestina Patches, MD  glucose blood test strip Check BS daily. Dx: E11.9 03/07/14    [provider]  INS SYRINGE/NEEDLE 1CC/28G 28G X 1/2" 1 ML MISC 31 gage  To use with insulin injections daily. 03/27/13   [provider]  insulin lispro (HUMALOG) 100 UNIT/ML injection Inject 0.25 mLs (25 Units total) into the skin every morning. 02/20/14   Ernestina Patches, MD  Insulin Syringe-Needle U-100 31G X 5/16" 1 ML MISC Use BID for Novolin injections and PRN SS  Novolog 10/16/13   [provider]  lidocaine (XYLOCAINE) 2 % jelly  03/16/14   [provider]  metFORMIN (GLUCOPHAGE) 500 MG tablet Take by mouth 2 (two) times daily with a meal.    [provider]  metFORMIN (GLUCOPHAGE) 500 MG tablet Take 500 mg by mouth. 12/15/13 12/15/14  [provider]  NOVOFINE PLUS 32G X 4 MM MISC See admin instructions. 03/21/14   [provider]  ReliOn Ultra Thin Lancets MISC Use to check BS three times daily. 03/03/10   [provider]    Physical Exam: Vitals:   07/03/18 2002 07/03/18 2045 07/03/18 2100 07/03/18 2200  BP: (!) 153/100   135/85  Pulse: 94     Resp: 19     Temp:      TempSrc:      SpO2: 100% 92% 99%    General: Not in acute distress HEENT: has left forehead hematoma       Eyes: PERRL, EOMI, no scleral icterus.       ENT: No discharge from the ears and nose, no pharynx injection, no tonsillar enlargement.        Neck: No JVD, no bruit, no mass felt. Heme: No neck lymph node enlargement. Cardiac: S1/S2, RRR, No murmurs, No gallops or rubs. Respiratory: No rales, wheezing, rhonchi or rubs. GI: Soft, nondistended, nontender, no organomegaly, BS present. GU: No hematuria Ext: No pitting leg edema bilaterally. 2+DP/PT pulse bilaterally. Musculoskeletal: No joint deformities, No joint redness or warmth, no limitation of ROM in spin. Skin: seems have scabies abdominal and groin area Neuro: confused, not following commands, not  oriented X3, cranial nerves II-XII grossly intact, moves all extremities. Psych:  Patient is not psychotic.  Labs on Admission: I have personally reviewed following labs and imaging studies  CBC: Recent Labs  Lab 07/03/18 1800  WBC 11.4*  NEUTROABS 8.1*  HGB 13.2  HCT 41.6  MCV 85.2  PLT 161   Basic Metabolic Panel: Recent Labs  Lab 07/03/18 1800  NA 136  K 3.6  CL 98  CO2 26  GLUCOSE 337*  BUN 10  CREATININE 1.04  CALCIUM 9.0   GFR: CrCl cannot be calculated (Unknown ideal weight.). Liver Function Tests: Recent Labs  Lab 07/03/18 1800  AST 26  ALT 26  ALKPHOS 111  BILITOT 0.8  PROT 7.0  ALBUMIN 3.7   No results for input(s): LIPASE, AMYLASE in the last 168 hours. No results for input(s): AMMONIA in the last 168 hours. Coagulation Profile: No results for input(s): INR, PROTIME in the last 168 hours. Cardiac Enzymes: No results for input(s): CKTOTAL, CKMB, CKMBINDEX, TROPONINI in  the last 168 hours. BNP (last 3 results) No results for input(s): PROBNP in the last 8760 hours. HbA1C: No results for input(s): HGBA1C in the last 72 hours. CBG: Recent Labs  Lab 07/03/18 1758  GLUCAP 302*   Lipid Profile: No results for input(s): CHOL, HDL, LDLCALC, TRIG, CHOLHDL, LDLDIRECT in the last 72 hours. Thyroid Function Tests: No results for input(s): TSH, T4TOTAL, FREET4, T3FREE, THYROIDAB in the last 72 hours. Anemia Panel: No results for input(s): VITAMINB12, FOLATE, FERRITIN, TIBC, IRON, RETICCTPCT in the last 72 hours. Urine analysis:    Component Value Date/Time   COLORURINE YELLOW 07/03/2018 1924   APPEARANCEUR HAZY (A) 07/03/2018 1924   LABSPEC 1.023 07/03/2018 1924   PHURINE 5.0 07/03/2018 1924   GLUCOSEU >=500 (A) 07/03/2018 [place] NEGATIVE 07/03/2018 [place] NEGATIVE 07/03/2018 1924   KETONESUR 5 (A) 07/03/2018 1924   PROTEINUR NEGATIVE 07/03/2018 1924   NITRITE NEGATIVE 07/03/2018 1924   LEUKOCYTESUR NEGATIVE 07/03/2018 1924   Sepsis Labs: '@LABRCNTIP' (procalcitonin:4,lacticidven:4) ) Recent Results (from  the past 240 hour(s))  SARS Coronavirus 2 (CEPHEID - Performed in [place] hospital lab), Hosp Order     Status: Abnormal   Collection Time: 07/03/18  7:50 PM   Specimen: Nasopharyngeal Swab  Result Value Ref Range Status   SARS Coronavirus 2 POSITIVE (A) NEGATIVE Final    Comment: RESULT CALLED TO, READ BACK BY AND VERIFIED WITH: RN P JACSON @ 2140 07/03/18 BY S GEZAHEGN  (NOTE) If result is NEGATIVE SARS-CoV-2 target nucleic acids are NOT DETECTED. The SARS-CoV-2 RNA is generally detectable in upper and lower  respiratory specimens during the acute phase of infection. The lowest  concentration of SARS-CoV-2 viral copies this assay can detect is 250  copies / mL. A negative result does not preclude SARS-CoV-2 infection  and should not be used as the sole basis for treatment or other  patient management decisions.  A negative result may occur with  improper specimen collection / handling, submission of specimen other  than nasopharyngeal swab, presence of viral mutation(s) within the  areas targeted by this assay, and inadequate number of viral copies  (<250 copies / mL). A negative result must be combined with clinical  observations, patient history, and epidemiological information. If result is POSITIVE SARS-CoV-2 target nucleic acids are DETECT ED. The SARS-CoV-2 RNA is generally detectable in upper and lower  respiratory specimens during the acute phase of infection.  Positive  results are indicative of active infection with SARS-CoV-2.  Clinical  correlation with patient history and other diagnostic information is  necessary to determine patient infection status.  Positive results do  not rule out bacterial infection or co-infection with other viruses. If result is PRESUMPTIVE POSTIVE SARS-CoV-2 nucleic acids MAY BE PRESENT.   A presumptive positive result was obtained on the submitted specimen  and confirmed on repeat testing.  While 2019 novel coronavirus  (SARS-CoV-2)  nucleic acids may be present in the submitted sample  additional confirmatory testing may be necessary for epidemiological  and / or clinical management purposes  to differentiate between  SARS-CoV-2 and other Sarbecovirus currently known to infect humans.  If clinically indicated additional testing with an alternate test  methodology (LAB74 53) is advised. The SARS-CoV-2 RNA is generally  detectable in upper and lower respiratory specimens during the acute  phase of infection. The expected result is Negative. Fact Sheet for Patients:  StrictlyIdeas.no Fact Sheet for Healthcare Providers: BankingDealers.co.za This test is not yet approved or cleared  by the [place] and has been authorized for detection and/or diagnosis of SARS-CoV-2 by FDA under an Emergency Use Authorization (EUA).  This EUA will remain in effect (meaning this test can be used) for the duration of the COVID-19 declaration under Section 564(b)(1) of the Act, 21 U.S.C. section 360bbb-3(b)(1), unless the authorization is terminated or revoked sooner. Performed at [facility], [place] [address], [place], [place] [address]      Radiological Exams on Admission: Ct Head Wo Contrast  Result Date: 07/03/2018 CLINICAL DATA:  Multiple falls, recent stroke EXAM: CT HEAD WITHOUT CONTRAST CT CERVICAL SPINE WITHOUT CONTRAST TECHNIQUE: Multidetector CT imaging of the head and cervical spine was performed following the standard protocol without intravenous contrast. Multiplanar CT image reconstructions of the cervical spine were also generated. COMPARISON:  06/12/2018, 06/05/2018 FINDINGS: CT HEAD FINDINGS Brain: There is a redemonstrated left hemispheric subdural hematoma, which has slightly enlarged compared to prior examination dated 06/12/2018, with a new, high attenuation internal components. This now measures approximately 8 mm in maximum coronal thickness, previously 6 mm.  There is no significant change in approximately 3 mm left right midline shift. Underlying small-vessel white matter disease and global volume loss. Vascular: No hyperdense vessel or unexpected calcification. Skull: Normal. Negative for fracture or focal lesion. Sinuses/Orbits: No acute finding. Other: Soft tissue contusion of the left frontal scalp. CT CERVICAL SPINE FINDINGS Alignment: Normal. Skull base and vertebrae: No acute fracture. No primary bone lesion or focal pathologic process. Soft tissues and spinal canal: No prevertebral fluid or swelling. No visible canal hematoma. 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 Upper chest: Negative. Other: None. IMPRESSION: 1. There is a redemonstrated left hemispheric subdural hematoma, which has slightly enlarged compared to prior examination dated 06/12/2018, with a new, high attenuation internal components. This now measures approximately 8 mm in maximum coronal thickness, previously 6 mm. There is no significant change in approximately 3 mm left right midline shift. Underlying small-vessel white matter disease and global volume loss. 2.  Soft tissue contusion of the left frontal scalp. 3. No fracture or static subluxation of the cervical spine. 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 Electronically Signed   By: Eddie Candle M.D.   On: 07/03/2018 18:30   Ct Cervical Spine Wo Contrast  Result Date: 07/03/2018 CLINICAL DATA:  Multiple falls, recent stroke EXAM: CT HEAD WITHOUT CONTRAST CT CERVICAL SPINE WITHOUT CONTRAST TECHNIQUE: Multidetector CT imaging of the head and cervical spine was performed following the standard protocol without intravenous contrast. Multiplanar CT image reconstructions of the cervical spine were also generated. COMPARISON:  06/12/2018, 06/05/2018 FINDINGS: CT HEAD FINDINGS Brain: There is a redemonstrated left hemispheric  subdural hematoma, which has slightly enlarged compared to prior examination dated 06/12/2018, with a new, high attenuation internal components. This now measures approximately 8 mm in maximum coronal thickness, previously 6 mm. There is no significant change in approximately 3 mm left right midline shift. Underlying small-vessel white matter disease and global volume loss. Vascular: No hyperdense vessel or unexpected calcification. Skull: Normal. Negative for fracture or focal lesion. Sinuses/Orbits: No acute finding. Other: Soft tissue contusion of the left frontal scalp. CT CERVICAL SPINE FINDINGS Alignment: Normal. Skull base and vertebrae: No acute fracture. No primary bone lesion or focal pathologic process. Soft tissues and spinal canal: No prevertebral fluid or swelling. No visible canal hematoma. Disc levels: Moderate to severe multilevel disc  and facet degenerative disease, worst at C3-C4 and C5-C6. Upper chest: Negative. Other: None. IMPRESSION: 1. There is a redemonstrated left hemispheric subdural hematoma, which has slightly enlarged compared to prior examination dated 06/12/2018, with a new, high attenuation internal components. This now measures approximately 8 mm in maximum coronal thickness, previously 6 mm. There is no significant change in approximately 3 mm left right midline shift. Underlying small-vessel white matter disease and global volume loss. 2.  Soft tissue contusion of the left frontal scalp. 3. No fracture or static subluxation of the cervical spine. 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 Electronically Signed   By: Eddie Candle M.D.   On: 07/03/2018 18:30   Dg Chest Portable 1 View  Result Date: 07/03/2018 CLINICAL DATA:  Fall EXAM: PORTABLE CHEST 1 VIEW COMPARISON:  None. FINDINGS: Cardiomegaly. Lungs clear. No effusions or edema. No acute bony abnormality. IMPRESSION: Cardiomegaly.  No active disease. Electronically Signed    By: Rolm Baptise M.D.   On: 07/03/2018 19:30     EKG: Independently reviewed.  Sinus rhythm, QTC 472, low voltage, right bundle blockage (no old EKG to compare)  Assessment/Plan Principal Problem:   SDH (subdural hematoma) ([facility]) Active Problems:   Hypertension   Hypercholesteremia   Diabetes mellitus without complication ([facility])   Fall   Leukocytosis   Acute metabolic encephalopathy   CAD (coronary artery disease)   COVID-19 virus infection   SDH (subdural hematoma) ([place]): pt has recent SDH 05/2018. Now present with fall and head injury. CT-head showed left SDH which is slightly enlarged compared to recent image 6/7, no significant change in approximately 3 mm left right midline shift. He has AMS.  Neurosurgeon, Dr. Astrid Drafts was consulted.  -will place on SDU for obs -Frequent neuro checks -Follow-up neurosurgeon's recommendation which is highly appreciated -repeat CT in AM  Fall: Not sure exactly what happened.  Per ED physician, patient seems to fell out of the wheelchair. EDP tried to contact pt's brother without success. -pt/ot when able to   Acute metabolic encephalopathy: Possibly due to SDH -Frequent neuro check -keep pt NPO until mental status improves -Hold oral medications until mental status improves  Leukocytosis: WBC 11.4.  Temperature 97.2.  Urinalysis negative.  Chest x-ray is negative for infiltration. -Follow-up of blood culture and urine culture  HTN:  -hole oral home medications: -IV hydralazine prn  Hypercholesteremia: -hold lipitor  Diabetes mellitus without complication ([place]): Last A1c is not on record. Patient is taking metformin and Humalog at home -SSI  CAD (coronary artery disease): Patient does not seem to have chest pain. -Hold Lipitor until mental state improves   Addendum: pt's Covid19 test positive.  Chest x-ray negative for infiltration.  No oxygen desaturation.  Patient seems to be asymptomatic. -vitamin C, zinc when able to eat (not  ordered yet) -PRN Xopenex inhaler if SOB -Follow-up flu PCR and RVP -f/u Blood culture -D-dimer, BNP,Trop, LFT, CRP, LDH, Procalcitonin, IL-6, ferritin, fibinogen, TG, Hep B SAg, HIV ab -Daily CRP, Ferritin, D-dimer, -if develop SOB, will ask the patient to maintain an awake prone position for 16+ hours a day, if possible, with a minimum of 2-3 hours at a time -Will attempt to maintain euvolemia to a net negative fluid status -Patient was seen wearing full PPE including: gown, gloves, head cover, N95 -IF patient deteriorates, will consult PCCM and ID  Possible scabies: seems have scabies abdominal and groin area -try Permethrin cream  DVT ppx: SCD Code  Status: DNR Family Communication: None at bed side.    Disposition Plan:  Anticipate discharge back to previous home environment Consults called:  Dr. Sherwood Gambler of neurosurgery Admission status: SDU/obs       Date of Service 07/03/2018    [facility]   If 7PM-7AM, please contact night-coverage [email or web address] Password [email or web address] 07/03/2018, 11:36 PM

## 2018-07-03 NOTE — ED Notes (Signed)
ED TO INPATIENT HANDOFF REPORT  ED Nurse Name and Phone #: Jeannett SeniorStephen [phone]  [id number]  S Name/Age/Gender Mitchell Scott 78 y.o. Scott Room/Bed: 012C/012C  Code Status   Code Status: Not on file  Home/SNF/Other Rehab Patient disoriented Is this baseline? No   Triage Complete: Triage complete  Chief Complaint fall AMS weakness  Triage Note Pt arrived to ed via [facility] after a second fall today. First fall at approximately 1400 today with second fall at 1700. After second fall pt unable to follow commands. Pt baseline is AOx4. EMS reports GCS of 10.   Allergies Allergies  Allergen Reactions  . Codeine     Other reaction(s): GASTRIC UPSET  . Penicillins Itching and Rash    Other reaction(s): UNKNOWN    Level of Care/Admitting Diagnosis ED Disposition    ED Disposition Condition Comment   Admit  The patient appears reasonably stabilized for admission considering the current resources, flow, and capabilities available in the ED at this time, and I doubt any other [facility] requiring further screening and/or treatment in the ED prior to admission is  present.       B Medical/Surgery History Past Medical History:  Diagnosis Date  . Diabetes mellitus without complication (HCC)   . Heart attack (HCC)   . Hypercholesteremia   . Hypertension   . Liver damage    Past Surgical History:  Procedure Laterality Date  . TONSILLECTOMY       A IV Location/Drains/Wounds Patient Lines/Drains/Airways Status   Active Line/Drains/Airways    Name:   Placement date:   Placement time:   Site:   Days:   Peripheral IV 07/03/18 Anterior;Left Wrist   07/03/18    1756    Wrist   less than 1   Peripheral IV 07/03/18 Right Wrist   07/03/18    1810    Wrist   less than 1          Intake/Output Last 24 hours No intake or output data in the 24 hours ending 07/03/18 2000  Labs/Imaging Results for orders placed or performed during the hospital encounter of 07/03/18 (from the past 48 hour(s))  CBG  monitoring, ED     Status: Abnormal   Collection Time: 07/03/18  5:58 PM  Result Value Ref Range   Glucose-Capillary 302 (H) 70 - 99 mg/dL   Comment 1 Notify RN    Comment 2 Document in Chart   CBC with Differential     Status: Abnormal   Collection Time: 07/03/18  6:00 PM  Result Value Ref Range   WBC 11.4 (H) 4.0 - 10.5 K/uL   RBC 4.88 4.22 - 5.81 MIL/uL   Hemoglobin 13.2 13.0 - 17.0 g/dL   HCT 60.441.6 54.039.0 - 98.152.0 %   MCV 85.2 80.0 - 100.0 fL   MCH 27.0 26.0 - 34.0 pg   MCHC 31.7 30.0 - 36.0 g/dL   RDW 19.116.5 (H) 47.811.5 - 29.515.5 %   Platelets 300 150 - 400 K/uL   nRBC 0.0 0.0 - 0.2 %   Neutrophils Relative % 71 %   Neutro Abs 8.1 (H) 1.7 - 7.7 K/uL   Lymphocytes Relative 12 %   Lymphs Abs 1.3 0.7 - 4.0 K/uL   Monocytes Relative 10 %   Monocytes Absolute 1.1 (H) 0.1 - 1.0 K/uL   Eosinophils Relative 6 %   Eosinophils Absolute 0.7 (H) 0.0 - 0.5 K/uL   Basophils Relative 1 %   Basophils Absolute 0.1 0.0 - 0.1 K/uL  Immature Granulocytes 0 %   Abs Immature Granulocytes 0.04 0.00 - 0.07 K/uL    Comment: Performed at [facility], [place] [address], [place], [place] [address]  Comprehensive metabolic panel     Status: Abnormal   Collection Time: 07/03/18  6:00 PM  Result Value Ref Range   Sodium 136 135 - 145 mmol/L   Potassium 3.6 3.5 - 5.1 mmol/L   Chloride 98 98 - 111 mmol/L   CO2 26 22 - 32 mmol/L   Glucose, Bld 337 (H) 70 - 99 mg/dL   BUN 10 8 - 23 mg/dL   Creatinine, Ser 1.04 0.61 - 1.24 mg/dL   Calcium 9.0 8.9 - 10.3 mg/dL   Total Protein 7.0 6.5 - 8.1 g/dL   Albumin 3.7 3.5 - 5.0 g/dL   AST 26 15 - 41 U/L   ALT 26 0 - 44 U/L   Alkaline Phosphatase 111 38 - 126 U/L   Total Bilirubin 0.8 0.3 - 1.2 mg/dL   GFR calc non Af Amer >60 >60 mL/min   GFR calc Af Amer >60 >60 mL/min   Anion gap 12 5 - 15    Comment: Performed at [facility] [address], [place], [place] [address]  Urinalysis, Routine w reflex microscopic     Status: Abnormal   Collection Time:  07/03/18  7:24 PM  Result Value Ref Range   Color, Urine YELLOW YELLOW   APPearance HAZY (A) CLEAR   Specific Gravity, Urine 1.023 1.005 - 1.030   pH 5.0 5.0 - 8.0   Glucose, UA >=500 (A) NEGATIVE mg/dL   Hgb urine dipstick NEGATIVE NEGATIVE   Bilirubin Urine NEGATIVE NEGATIVE   Ketones, ur 5 (A) NEGATIVE mg/dL   Protein, ur NEGATIVE NEGATIVE mg/dL   Nitrite NEGATIVE NEGATIVE   Leukocytes,Ua NEGATIVE NEGATIVE   RBC / HPF 0-5 0 - 5 RBC/hpf   WBC, UA 0-5 0 - 5 WBC/hpf   Bacteria, UA RARE (A) NONE SEEN   Squamous Epithelial / LPF 0-5 0 - 5   Mucus PRESENT    Ca Oxalate Crys, UA PRESENT     Comment: Performed at [facility], [address] [place], [place] [address]   Ct Head Wo Contrast  Result Date: 07/03/2018 CLINICAL DATA:  Multiple falls, recent stroke EXAM: CT HEAD WITHOUT CONTRAST CT CERVICAL SPINE WITHOUT CONTRAST TECHNIQUE: Multidetector CT imaging of the head and cervical spine was performed following the standard protocol without intravenous contrast. Multiplanar CT image reconstructions of the cervical spine were also generated. COMPARISON:  06/12/2018, 06/05/2018 FINDINGS: CT HEAD FINDINGS Brain: There is a redemonstrated left hemispheric subdural hematoma, which has slightly enlarged compared to prior examination dated 06/12/2018, with a new, high attenuation internal components. This now measures approximately 8 mm in maximum coronal thickness, previously 6 mm. There is no significant change in approximately 3 mm left right midline shift. Underlying small-vessel white matter disease and global volume loss. Vascular: No hyperdense vessel or unexpected calcification. Skull: Normal. Negative for fracture or focal lesion. Sinuses/Orbits: No acute finding. Other: Soft tissue contusion of the left frontal scalp. CT CERVICAL SPINE FINDINGS Alignment: Normal. Skull base and vertebrae: No acute fracture. No primary bone lesion or focal pathologic process. Soft tissues and spinal  canal: No prevertebral fluid or swelling. No visible canal hematoma. 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 Upper chest: Negative. Other: None. IMPRESSION: 1. There is a redemonstrated  left hemispheric subdural hematoma, which has slightly enlarged compared to prior examination dated 06/12/2018, with a new, high attenuation internal components. This now measures approximately 8 mm in maximum coronal thickness, previously 6 mm. There is no significant change in approximately 3 mm left right midline shift. Underlying small-vessel white matter disease and global volume loss. 2.  Soft tissue contusion of the left frontal scalp. 3. No fracture or static subluxation of the cervical spine. 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 Electronically Signed   By: Lauralyn PrimesAlex  Bibbey M.D.   On: 07/03/2018 18:30   Ct Cervical Spine Wo Contrast  Result Date: 07/03/2018 CLINICAL DATA:  Multiple falls, recent stroke EXAM: CT HEAD WITHOUT CONTRAST CT CERVICAL SPINE WITHOUT CONTRAST TECHNIQUE: Multidetector CT imaging of the head and cervical spine was performed following the standard protocol without intravenous contrast. Multiplanar CT image reconstructions of the cervical spine were also generated. COMPARISON:  06/12/2018, 06/05/2018 FINDINGS: CT HEAD FINDINGS Brain: There is a redemonstrated left hemispheric subdural hematoma, which has slightly enlarged compared to prior examination dated 06/12/2018, with a new, high attenuation internal components. This now measures approximately 8 mm in maximum coronal thickness, previously 6 mm. There is no significant change in approximately 3 mm left right midline shift. Underlying small-vessel white matter disease and global volume loss. Vascular: No hyperdense vessel or unexpected calcification. Skull: Normal. Negative for fracture or focal lesion. Sinuses/Orbits: No acute  finding. Other: Soft tissue contusion of the left frontal scalp. CT CERVICAL SPINE FINDINGS Alignment: Normal. Skull base and vertebrae: No acute fracture. No primary bone lesion or focal pathologic process. Soft tissues and spinal canal: No prevertebral fluid or swelling. No visible canal hematoma. 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 Upper chest: Negative. Other: None. IMPRESSION: 1. There is a redemonstrated left hemispheric subdural hematoma, which has slightly enlarged compared to prior examination dated 06/12/2018, with a new, high attenuation internal components. This now measures approximately 8 mm in maximum coronal thickness, previously 6 mm. There is no significant change in approximately 3 mm left right midline shift. Underlying small-vessel white matter disease and global volume loss. 2.  Soft tissue contusion of the left frontal scalp. 3. No fracture or static subluxation of the cervical spine. 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 Electronically Signed   By: Lauralyn PrimesAlex  Bibbey M.D.   On: 07/03/2018 18:30   Dg Chest Portable 1 View  Result Date: 07/03/2018 CLINICAL DATA:  Fall EXAM: PORTABLE CHEST 1 VIEW COMPARISON:  None. FINDINGS: Cardiomegaly. Lungs clear. No effusions or edema. No acute bony abnormality. IMPRESSION: Cardiomegaly.  No active disease. Electronically Signed   By: Charlett NoseKevin  Dover M.D.   On: 07/03/2018 19:30    Pending Labs Unresulted Labs (From admission, onward)    Start     Ordered   07/03/18 1950  SARS Coronavirus 2 (CEPHEID - Performed [facility] Order  (Asymptomatic Patients Labs)  ONCE - STAT,   STAT    Question:  Rule Out  Answer:  Yes   07/03/18 1949   07/03/18 1802  Urine culture  ONCE - STAT,   STAT     07/03/18 1802          Vitals/Pain Today's Vitals   07/03/18 1801 07/03/18 1802 07/03/18 1845 07/03/18 1915  BP:  (!) 151/86  (!) 143/83 139/75  Pulse: (!) 103  86 85  Resp:   19 11  Temp: (!) 97.2 F (36.2 C)     TempSrc: Temporal     SpO2: 99%  99% 99%  PainSc:  0-No pain      Isolation Precautions No active isolations  Medications Medications - No data to display  Mobility walks with device at baseline, not assessed in ED High fall risk   Focused Assessments Neuro Assessment Handoff:  Swallow screen pass? Not ordered         Neuro Assessment: Exceptions to WDL Neuro Checks:      Last Documented NIHSS Modified Score:   Has TPA been given? No If patient is a Neuro Trauma and patient is going to OR before floor call report to 4N Charge nurse: [phone] or [phone]     R Recommendations: See Admitting Provider Note  Report given to:   Additional Notes:

## 2018-07-03 NOTE — ED Provider Notes (Signed)
[facility] EMERGENCY DEPARTMENT Provider Note   CSN: [id number] Arrival date & time: 07/03/18  1755    History   Chief Complaint Chief Complaint  Patient presents with  . Fall    HPI Jakaree Damasco is a 78 y.o. male.     LEVEL 5 caveat due to altered mental stasus, Left forehead hematoma after fall, recent SDH. Patient usually alert and has been confused since second fall. No blood thinners.   The history is provided by the patient.  Fall This is a new problem. The current episode started 1 to 2 hours ago. The problem occurs constantly. The problem has not changed since onset.Nothing aggravates the symptoms. Nothing relieves the symptoms. He has tried nothing for the symptoms. The treatment provided no relief.    Past Medical History:  Diagnosis Date  . Diabetes mellitus without complication (HCC)   . Heart attack (HCC)   . Hypercholesteremia   . Hypertension   . Liver damage   . SDH (subdural hematoma) [facility])     Patient Active Problem List   Diagnosis Date Noted  . Fall 07/03/2018  . Leukocytosis 07/03/2018  . Acute metabolic encephalopathy 07/03/2018  . CAD (coronary artery disease) 07/03/2018  . SDH (subdural hematoma) (HCC)   . Hypertension   . Hypercholesteremia   . Diabetes mellitus without complication [facility])     Past Surgical History:  Procedure Laterality Date  . TONSILLECTOMY          Home Medications    Prior to Admission medications   Medication Sig Start Date End Date Taking? Authorizing Provider  atorvastatin (LIPITOR) 20 MG tablet Take 20 mg by mouth daily.    [provider]  atorvastatin (LIPITOR) 20 MG tablet Take 20 mg by mouth. 12/15/13 12/15/14  [provider]  atorvastatin (LIPITOR) 80 MG tablet Take 80 mg by mouth. 05/23/18   [provider]  Blood Glucose Monitoring Suppl (ONE TOUCH ULTRA MINI) W/DEVICE KIT Frequency:   Dosage:NULL   KIT  Instructions:Use to check BS daily.   Note:Dx:250.00   Insulin Treated 02/01/12   [provider]  dextrose (GLUTOSE) 40 % gel Take 15 g by mouth once. 02/20/14   Toy Cookey, MD  fosinopril (MONOPRIL) 10 MG tablet Take 10 mg by mouth daily.    [provider]  fosinopril (MONOPRIL) 10 MG tablet Take 10 mg by mouth. 12/15/13 12/15/14  [provider]  fosinopril (MONOPRIL) 20 MG tablet Take 20 mg by mouth. 06/06/18   [provider]  glucose blood (FREESTYLE TEST STRIPS) test strip Use as instructed 02/20/14   Toy Cookey, MD  glucose blood test strip Check BS daily. Dx: E11.9 03/07/14   [provider]  ibuprofen (ADVIL) 600 MG tablet Take 600 mg by mouth. 01/17/18   [provider]  INS SYRINGE/NEEDLE 1CC/28G 28G X 1/2" 1 ML MISC 31 gage  To use with insulin injections daily. 03/27/13   [provider]  insulin detemir (LEVEMIR) 100 UNIT/ML injection Inject 22 Units into the skin at bedtime.    [provider]  insulin lispro (HUMALOG) 100 UNIT/ML injection Inject 0.25 mLs (25 Units total) into the skin every morning. 02/20/14   Toy Cookey, MD  Insulin Syringe-Needle U-100 31G X 5/16" 1 ML MISC Use BID for Novolin injections and PRN SS  Novolog 10/16/13   [provider]  levETIRAcetam (KEPPRA) 500 MG tablet Take 500 mg by mouth. 06/09/18   [provider]  lidocaine (XYLOCAINE)  2 % jelly  03/16/14   [provider]  medroxyPROGESTERone (PROVERA) 5 MG tablet Take 5 mg by mouth. 05/31/18   [provider]  metFORMIN (GLUCOPHAGE) 500 MG tablet Take by mouth 2 (two) times daily with a meal.    [provider]  metFORMIN (GLUCOPHAGE) 500 MG tablet Take 500 mg by mouth. 12/15/13 12/15/14  [provider]  NOVOFINE PLUS 32G X 4 MM MISC See admin instructions. 03/21/14   [provider]  permethrin (ELIMITE) 5 % cream Apply 1 application topically. 06/06/18   [provider]  ReliOn Ultra Thin Lancets  MISC Use to check BS three times daily. 03/03/10   [provider]  triamcinolone cream (KENALOG) 0.1 % Apply 1 application topically. 06/02/18   [provider]    Family History History reviewed. No pertinent family history.  Social History Social History   Tobacco Use  . Smoking status: Never Smoker  Substance Use Topics  . Alcohol use: No  . Drug use: No     Allergies   Codeine and Penicillins   Review of Systems Review of Systems  Unable to perform ROS: Mental status change     Physical Exam Updated Vital Signs  ED Triage Vitals  Enc Vitals Group     BP 07/03/18 1800 (!) 151/86     Pulse Rate 07/03/18 1800 (!) 102     Resp 07/03/18 1800 17     Temp 07/03/18 1801 (!) 97.2 F (36.2 C)     Temp Source 07/03/18 1801 Temporal     SpO2 07/03/18 1800 97 %     Weight --      Height --      Head Circumference --      Peak Flow --      Pain Score 07/03/18 1802 0     Pain Loc --      Pain Edu? --      Excl. in GC? --     Physical Exam Vitals signs and nursing note reviewed.  Constitutional:      General: He is in acute distress.     Appearance: He is well-developed.  HENT:     Head:     Comments: Large left forehead hematoma    Nose: Nose normal.     Mouth/Throat:     Mouth: Mucous membranes are moist.  Eyes:     Extraocular Movements: Extraocular movements intact.     Conjunctiva/sclera: Conjunctivae normal.     Pupils: Pupils are equal, round, and reactive to light.  Neck:     Musculoskeletal: Neck supple. No muscular tenderness.  Cardiovascular:     Rate and Rhythm: Normal rate and regular rhythm.     Pulses: Normal pulses.     Heart sounds: Normal heart sounds. No murmur.  Pulmonary:     Effort: Pulmonary effort is normal. No respiratory distress.     Breath sounds: Normal breath sounds.  Abdominal:     Palpations: Abdomen is soft.     Tenderness: There is no abdominal tenderness.  Musculoskeletal: Normal range of motion.          General: No tenderness.  Skin:    General: Skin is warm and dry.     Capillary Refill: Capillary refill takes less than 2 seconds.  Neurological:     Mental Status: He is alert.     GCS: GCS eye subscore is 4. GCS verbal subscore is 3. GCS motor subscore is 5.  Psychiatric:  Mood and Affect: Mood normal.      ED Treatments / Results  Labs (all labs ordered are listed, but only abnormal results are displayed) Labs Reviewed  CBC WITH DIFFERENTIAL/PLATELET - Abnormal; Notable for the following components:      Result Value   WBC 11.4 (*)    RDW 16.5 (*)    Neutro Abs 8.1 (*)    Monocytes Absolute 1.1 (*)    Eosinophils Absolute 0.7 (*)    All other components within normal limits  COMPREHENSIVE METABOLIC PANEL - Abnormal; Notable for the following components:   Glucose, Bld 337 (*)    All other components within normal limits  URINALYSIS, ROUTINE W REFLEX MICROSCOPIC - Abnormal; Notable for the following components:   APPearance HAZY (*)    Glucose, UA >=500 (*)    Ketones, ur 5 (*)    Bacteria, UA RARE (*)    All other components within normal limits  CBG MONITORING, ED - Abnormal; Notable for the following components:   Glucose-Capillary 302 (*)    All other components within normal limits  URINE CULTURE  SARS CORONAVIRUS 2 (HOSPITAL ORDER, PERFORMED IN [facility])    EKG EKG Interpretation  Date/Time:  Sunday July 03 2018 17:56:28 EDT Ventricular Rate:  103 PR Interval:    QRS Duration: 147 QT Interval:  360 QTC Calculation: 472 R Axis:   83 Text Interpretation:  Sinus tachycardia Right bundle branch block Confirmed by Virgina Norfolk [phone]) on 07/03/2018 7:11:21 PM   Radiology Ct Head Wo Contrast  Result Date: 07/03/2018 CLINICAL DATA:  Multiple falls, recent stroke EXAM: CT HEAD WITHOUT CONTRAST CT CERVICAL SPINE WITHOUT CONTRAST TECHNIQUE: Multidetector CT imaging of the head and cervical spine was performed following the standard  protocol without intravenous contrast. Multiplanar CT image reconstructions of the cervical spine were also generated. COMPARISON:  06/12/2018, 06/05/2018 FINDINGS: CT HEAD FINDINGS Brain: There is a redemonstrated left hemispheric subdural hematoma, which has slightly enlarged compared to prior examination dated 06/12/2018, with a new, high attenuation internal components. This now measures approximately 8 mm in maximum coronal thickness, previously 6 mm. There is no significant change in approximately 3 mm left right midline shift. Underlying small-vessel white matter disease and global volume loss. Vascular: No hyperdense vessel or unexpected calcification. Skull: Normal. Negative for fracture or focal lesion. Sinuses/Orbits: No acute finding. Other: Soft tissue contusion of the left frontal scalp. CT CERVICAL SPINE FINDINGS Alignment: Normal. Skull base and vertebrae: No acute fracture. No primary bone lesion or focal pathologic process. Soft tissues and spinal canal: No prevertebral fluid or swelling. No visible canal hematoma. 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 Upper chest: Negative. Other: None. IMPRESSION: 1. There is a redemonstrated left hemispheric subdural hematoma, which has slightly enlarged compared to prior examination dated 06/12/2018, with a new, high attenuation internal components. This now measures approximately 8 mm in maximum coronal thickness, previously 6 mm. There is no significant change in approximately 3 mm left right midline shift. Underlying small-vessel white matter disease and global volume loss. 2.  Soft tissue contusion of the left frontal scalp. 3. No fracture or static subluxation of the cervical spine. 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 Electronically Signed   By: Lauralyn Primes M.D.   On: 07/03/2018 18:30   Ct Cervical Spine Wo Contrast  Result  Date: 07/03/2018 CLINICAL DATA:  Multiple falls, recent stroke  EXAM: CT HEAD WITHOUT CONTRAST CT CERVICAL SPINE WITHOUT CONTRAST TECHNIQUE: Multidetector CT imaging of the head and cervical spine was performed following the standard protocol without intravenous contrast. Multiplanar CT image reconstructions of the cervical spine were also generated. COMPARISON:  06/12/2018, 06/05/2018 FINDINGS: CT HEAD FINDINGS Brain: There is a redemonstrated left hemispheric subdural hematoma, which has slightly enlarged compared to prior examination dated 06/12/2018, with a new, high attenuation internal components. This now measures approximately 8 mm in maximum coronal thickness, previously 6 mm. There is no significant change in approximately 3 mm left right midline shift. Underlying small-vessel white matter disease and global volume loss. Vascular: No hyperdense vessel or unexpected calcification. Skull: Normal. Negative for fracture or focal lesion. Sinuses/Orbits: No acute finding. Other: Soft tissue contusion of the left frontal scalp. CT CERVICAL SPINE FINDINGS Alignment: Normal. Skull base and vertebrae: No acute fracture. No primary bone lesion or focal pathologic process. Soft tissues and spinal canal: No prevertebral fluid or swelling. No visible canal hematoma. 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 Upper chest: Negative. Other: None. IMPRESSION: 1. There is a redemonstrated left hemispheric subdural hematoma, which has slightly enlarged compared to prior examination dated 06/12/2018, with a new, high attenuation internal components. This now measures approximately 8 mm in maximum coronal thickness, previously 6 mm. There is no significant change in approximately 3 mm left right midline shift. Underlying small-vessel white matter disease and global volume loss. 2.  Soft tissue contusion of the left frontal scalp. 3. No fracture or static subluxation  of the cervical spine. 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 Electronically Signed   By: Lauralyn Primes M.D.   On: 07/03/2018 18:30   Dg Chest Portable 1 View  Result Date: 07/03/2018 CLINICAL DATA:  Fall EXAM: PORTABLE CHEST 1 VIEW COMPARISON:  None. FINDINGS: Cardiomegaly. Lungs clear. No effusions or edema. No acute bony abnormality. IMPRESSION: Cardiomegaly.  No active disease. Electronically Signed   By: Charlett Nose M.D.   On: 07/03/2018 19:30    Procedures .Critical Care Performed by: Virgina Norfolk, DO Authorized by: Virgina Norfolk, DO   Critical care provider statement:    Critical care time (minutes):  35   Critical care was necessary to treat or prevent imminent or life-threatening deterioration of the following conditions:  CNS failure or compromise   Critical care was time spent personally by me on the following activities:  Development of treatment plan with patient or surrogate, discussions with consultants, blood draw for specimens, discussions with primary provider, evaluation of patient's response to treatment, examination of patient, ordering and review of laboratory studies, ordering and performing treatments and interventions, ordering and review of radiographic studies, re-evaluation of patient's condition, pulse oximetry and obtaining history from patient or surrogate   I assumed direction of critical care for this patient from another provider in my specialty: no     (including critical care time)  Medications Ordered in ED Medications  hydrALAZINE (APRESOLINE) injection 5 mg (has no administration in time range)  ondansetron (ZOFRAN) injection 4 mg (has no administration in time range)     Initial Impression / Assessment and Plan / ED Course  I have reviewed the triage vital signs and the nursing notes.  Pertinent labs & imaging results that were available during my care of the patient were reviewed by me and  considered in my medical decision making (see chart for details).  Cedrick Aubry is a 78 year old male with history of diabetes, high cholesterol, hypertension who presents to the ED with confusion after fall.  Recent subdural hematoma.  Patient with normal vitals.  No fever.  Patient appears to be encephalopathic, confused, concussed.  Has large left forehead hematoma.  Patient is not on blood thinners.  He has a GCS of 12.  He follows commands.  Although he appears confused.  Concern for another head injury.  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  No change in midline shift.  Neurosurgery consulted and evaluated the patient.  There will be recommendations but no need for surgical intervention at this time.  Can be admitted to medicine.  Suspect that confusion is from subdural hematoma.  Patient with no history of seizures.  Neuro exam is stable.  Does not have any obvious weakness.  No signs of herniation.  Lab work otherwise unremarkable.  Patient admitted to medicine for further care.  This chart was dictated using voice recognition software.  Despite best efforts to proofread,  errors can occur which can change the documentation meaning.    Final Clinical Impressions(s) / ED Diagnoses   Final diagnoses:  SDH (subdural hematoma) [facility])    ED Discharge Orders    None       Virgina Norfolk, DO 07/03/18 2014

## 2018-07-04 ENCOUNTER — Observation Stay (HOSPITAL_COMMUNITY): Payer: Medicare Other

## 2018-07-04 DIAGNOSIS — G9341 Metabolic encephalopathy: Secondary | ICD-10-CM | POA: Diagnosis not present

## 2018-07-04 DIAGNOSIS — I2510 Atherosclerotic heart disease of native coronary artery without angina pectoris: Secondary | ICD-10-CM | POA: Diagnosis present

## 2018-07-04 DIAGNOSIS — I252 Old myocardial infarction: Secondary | ICD-10-CM | POA: Diagnosis not present

## 2018-07-04 DIAGNOSIS — F0390 Unspecified dementia without behavioral disturbance: Secondary | ICD-10-CM | POA: Diagnosis present

## 2018-07-04 DIAGNOSIS — S065X9A Traumatic subdural hemorrhage with loss of consciousness of unspecified duration, initial encounter: Secondary | ICD-10-CM | POA: Diagnosis present

## 2018-07-04 DIAGNOSIS — I10 Essential (primary) hypertension: Secondary | ICD-10-CM | POA: Diagnosis present

## 2018-07-04 DIAGNOSIS — Z66 Do not resuscitate: Secondary | ICD-10-CM | POA: Diagnosis present

## 2018-07-04 DIAGNOSIS — E1165 Type 2 diabetes mellitus with hyperglycemia: Secondary | ICD-10-CM | POA: Diagnosis present

## 2018-07-04 DIAGNOSIS — U071 COVID-19: Secondary | ICD-10-CM | POA: Diagnosis present

## 2018-07-04 DIAGNOSIS — R296 Repeated falls: Secondary | ICD-10-CM | POA: Diagnosis present

## 2018-07-04 DIAGNOSIS — E7800 Pure hypercholesterolemia, unspecified: Secondary | ICD-10-CM | POA: Diagnosis present

## 2018-07-04 DIAGNOSIS — W1830XA Fall on same level, unspecified, initial encounter: Secondary | ICD-10-CM | POA: Diagnosis present

## 2018-07-04 DIAGNOSIS — E119 Type 2 diabetes mellitus without complications: Secondary | ICD-10-CM | POA: Diagnosis not present

## 2018-07-04 DIAGNOSIS — D72829 Elevated white blood cell count, unspecified: Secondary | ICD-10-CM | POA: Diagnosis present

## 2018-07-04 DIAGNOSIS — Z794 Long term (current) use of insulin: Secondary | ICD-10-CM | POA: Diagnosis not present

## 2018-07-04 DIAGNOSIS — Y92129 Unspecified place in nursing home as the place of occurrence of the external cause: Secondary | ICD-10-CM | POA: Diagnosis not present

## 2018-07-04 LAB — CBC
HCT: 38.4 % — ABNORMAL LOW (ref 39.0–52.0)
Hemoglobin: 12.4 g/dL — ABNORMAL LOW (ref 13.0–17.0)
MCH: 27.6 pg (ref 26.0–34.0)
MCHC: 32.3 g/dL (ref 30.0–36.0)
MCV: 85.3 fL (ref 80.0–100.0)
Platelets: 271 10*3/uL (ref 150–400)
RBC: 4.5 MIL/uL (ref 4.22–5.81)
RDW: 16.4 % — ABNORMAL HIGH (ref 11.5–15.5)
WBC: 9.8 10*3/uL (ref 4.0–10.5)
nRBC: 0 % (ref 0.0–0.2)

## 2018-07-04 LAB — BLOOD CULTURE ID PANEL (REFLEXED)

## 2018-07-04 LAB — C-REACTIVE PROTEIN
CRP: <0.8 mg/dL (ref <1.0)
CRP: <0.8 mg/dL (ref <1.0)

## 2018-07-04 LAB — COMPREHENSIVE METABOLIC PANEL
ALT: 22 U/L (ref 0–44)
AST: 22 U/L (ref 15–41)
Albumin: 3.3 g/dL — ABNORMAL LOW (ref 3.5–5.0)
Alkaline Phosphatase: 93 U/L (ref 38–126)
Anion gap: 12 (ref 5–15)
BUN: 8 mg/dL (ref 8–23)
CO2: 26 mmol/L (ref 22–32)
Calcium: 8.7 mg/dL — ABNORMAL LOW (ref 8.9–10.3)
Chloride: 101 mmol/L (ref 98–111)
Creatinine, Ser: 0.92 mg/dL (ref 0.61–1.24)
GFR calc Af Amer: >60 mL/min (ref >60)
GFR calc non Af Amer: >60 mL/min (ref >60)
Glucose, Bld: 226 mg/dL — ABNORMAL HIGH (ref 70–99)
Potassium: 3.5 mmol/L (ref 3.5–5.1)
Sodium: 139 mmol/L (ref 135–145)
Total Bilirubin: 1 mg/dL (ref 0.3–1.2)
Total Protein: 6.3 g/dL — ABNORMAL LOW (ref 6.5–8.1)

## 2018-07-04 LAB — APTT: aPTT: 30 seconds (ref 24–36)

## 2018-07-04 LAB — BASIC METABOLIC PANEL
Anion gap: 12 (ref 5–15)
BUN: 8 mg/dL (ref 8–23)
CO2: 26 mmol/L (ref 22–32)
Calcium: 8.7 mg/dL — ABNORMAL LOW (ref 8.9–10.3)
Chloride: 99 mmol/L (ref 98–111)
Creatinine, Ser: 0.82 mg/dL (ref 0.61–1.24)
GFR calc Af Amer: >60 mL/min (ref >60)
GFR calc non Af Amer: >60 mL/min (ref >60)
Glucose, Bld: 258 mg/dL — ABNORMAL HIGH (ref 70–99)
Potassium: 3.8 mmol/L (ref 3.5–5.1)
Sodium: 137 mmol/L (ref 135–145)

## 2018-07-04 LAB — FERRITIN
Ferritin: 380 ng/mL — ABNORMAL HIGH (ref 24–336)
Ferritin: 345 ng/mL — ABNORMAL HIGH (ref 24–336)

## 2018-07-04 LAB — URINE CULTURE: Culture: NO GROWTH

## 2018-07-04 LAB — TYPE AND SCREEN
ABO/RH(D): O POS
Antibody Screen: NEGATIVE

## 2018-07-04 LAB — TROPONIN I (HIGH SENSITIVITY)
Troponin I (High Sensitivity): 9 ng/L (ref <18)
Troponin I (High Sensitivity): 10 ng/L (ref <18)

## 2018-07-04 LAB — PROTIME-INR
INR: 1.2 (ref 0.8–1.2)
Prothrombin Time: 14.6 seconds (ref 11.4–15.2)

## 2018-07-04 LAB — D-DIMER, QUANTITATIVE
D-Dimer, Quant: 1.56 ug/mL-FEU — ABNORMAL HIGH (ref 0.00–0.50)
D-Dimer, Quant: 1.43 ug/mL-FEU — ABNORMAL HIGH (ref 0.00–0.50)

## 2018-07-04 LAB — GLUCOSE, CAPILLARY
Glucose-Capillary: 193 mg/dL — ABNORMAL HIGH (ref 70–99)
Glucose-Capillary: 148 mg/dL — ABNORMAL HIGH (ref 70–99)
Glucose-Capillary: 175 mg/dL — ABNORMAL HIGH (ref 70–99)

## 2018-07-04 LAB — SEDIMENTATION RATE: Sed Rate: 2 mm/hr (ref 0–16)

## 2018-07-04 LAB — BRAIN NATRIURETIC PEPTIDE: B Natriuretic Peptide: 39.1 pg/mL (ref 0.0–100.0)

## 2018-07-04 LAB — CBG MONITORING, ED: Glucose-Capillary: 270 mg/dL — ABNORMAL HIGH (ref 70–99)

## 2018-07-04 LAB — LACTATE DEHYDROGENASE: LDH: 237 U/L — ABNORMAL HIGH (ref 98–192)

## 2018-07-04 LAB — TRIGLYCERIDES
Triglycerides: 78 mg/dL (ref <150)
Triglycerides: 74 mg/dL (ref <150)

## 2018-07-04 LAB — FIBRINOGEN: Fibrinogen: 368 mg/dL (ref 210–475)

## 2018-07-04 LAB — CK: Total CK: 120 U/L (ref 49–397)

## 2018-07-04 LAB — ABO/RH: ABO/RH(D): O POS

## 2018-07-04 LAB — PROCALCITONIN: Procalcitonin: <0.1 ng/mL

## 2018-07-04 MED ORDER — INSULIN ASPART 100 UNIT/ML ~~LOC~~ SOLN
0.0000 [IU] | Freq: Three times a day (TID) | SUBCUTANEOUS | Status: DC
  Administered 2018-07-04: 1 [IU] via SUBCUTANEOUS
  Administered 2018-07-04 – 2018-07-05 (×2): 2 [IU] via SUBCUTANEOUS
  Administered 2018-07-05: 3 [IU] via SUBCUTANEOUS

## 2018-07-04 MED ORDER — PERMETHRIN 5 % EX CREA
TOPICAL_CREAM | Freq: Once | CUTANEOUS | Status: AC
  Administered 2018-07-04: 11:00:00 via TOPICAL
  Filled 2018-07-04: qty 60

## 2018-07-04 MED ORDER — IPRATROPIUM BROMIDE HFA 17 MCG/ACT IN AERS
2.0000 | INHALATION_SPRAY | Freq: Four times a day (QID) | RESPIRATORY_TRACT | Status: DC | PRN
  Filled 2018-07-04: qty 12.9

## 2018-07-04 MED ORDER — VITAMIN D 25 MCG (1000 UNIT) PO TABS
1000.0000 [IU] | ORAL_TABLET | Freq: Every day | ORAL | Status: DC
  Administered 2018-07-06 – 2018-07-08 (×3): 1000 [IU] via ORAL
  Filled 2018-07-04 (×3): qty 1

## 2018-07-04 MED ORDER — AMLODIPINE BESYLATE 5 MG PO TABS
5.0000 mg | ORAL_TABLET | Freq: Every day | ORAL | Status: DC
  Administered 2018-07-06 – 2018-07-08 (×3): 5 mg via ORAL
  Filled 2018-07-04 (×3): qty 1

## 2018-07-04 MED ORDER — VITAMIN C 500 MG/5ML PO SYRP
1000.0000 mg | ORAL_SOLUTION | Freq: Every day | ORAL | Status: DC
  Filled 2018-07-04 (×2): qty 10

## 2018-07-04 MED ORDER — LISINOPRIL 20 MG PO TABS: 20.0000 mg | ORAL_TABLET | Freq: Every day | ORAL | Status: DC

## 2018-07-04 MED ORDER — LORAZEPAM 2 MG/ML IJ SOLN
2.0000 mg | Freq: Once | INTRAMUSCULAR | Status: AC
  Administered 2018-07-04: 2 mg via INTRAVENOUS
  Filled 2018-07-04: qty 1

## 2018-07-04 MED ORDER — METHYLPREDNISOLONE SODIUM SUCC 40 MG IJ SOLR
40.0000 mg | Freq: Every day | INTRAMUSCULAR | Status: DC
  Administered 2018-07-04 – 2018-07-07 (×4): 40 mg via INTRAVENOUS
  Filled 2018-07-04 (×4): qty 1

## 2018-07-04 MED ORDER — INSULIN ASPART 100 UNIT/ML ~~LOC~~ SOLN
0.0000 [IU] | Freq: Every day | SUBCUTANEOUS | Status: DC
  Administered 2018-07-04: 3 [IU] via SUBCUTANEOUS

## 2018-07-04 MED ORDER — ZINC SULFATE 220 (50 ZN) MG PO CAPS
220.0000 mg | ORAL_CAPSULE | Freq: Every day | ORAL | Status: DC
  Administered 2018-07-06 – 2018-07-08 (×3): 220 mg via ORAL
  Filled 2018-07-04 (×3): qty 1

## 2018-07-04 MED ORDER — ACETAMINOPHEN 325 MG PO TABS: 650.0000 mg | ORAL_TABLET | Freq: Four times a day (QID) | ORAL | Status: DC | PRN

## 2018-07-04 MED ORDER — ACETAMINOPHEN 650 MG RE SUPP: 650.0000 mg | Freq: Four times a day (QID) | RECTAL | Status: DC | PRN

## 2018-07-04 NOTE — Progress Notes (Signed)
PHARMACY - PHYSICIAN COMMUNICATION CRITICAL VALUE ALERT - BLOOD CULTURE IDENTIFICATION (BCID)  Mitchell Scott is an 78 y.o. male who presented to [facility] on 07/03/2018 with a chief complaint of fall and AMS.  Assessment: Patient is COVID-19 +. One out of 4 blood cultures with GPC. BCID showing staph species, No methicillin resistance detected. WBC down to 9.8 without antibiotics. Afebrile. PCT negative. CRP negative.   Name of physician (or Provider) Contacted: Tylene Fantasia  Current antibiotics: None  Changes to prescribed antibiotics recommended:  Response not received from provider. Possible contaminant. No antibiotics for now and monitor.   Results for orders placed or performed during the hospital encounter of 07/03/18  Blood Culture ID Panel (Reflexed) (Collected: 07/04/2018  2:26 AM)  Result Value Ref Range   Enterococcus species NOT DETECTED NOT DETECTED   Listeria monocytogenes NOT DETECTED NOT DETECTED   Staphylococcus species DETECTED (A) NOT DETECTED   Staphylococcus aureus (BCID) NOT DETECTED NOT DETECTED   Methicillin resistance NOT DETECTED NOT DETECTED   Streptococcus species NOT DETECTED NOT DETECTED   Streptococcus agalactiae NOT DETECTED NOT DETECTED   Streptococcus pneumoniae NOT DETECTED NOT DETECTED   Streptococcus pyogenes NOT DETECTED NOT DETECTED   Acinetobacter baumannii NOT DETECTED NOT DETECTED   Enterobacteriaceae species NOT DETECTED NOT DETECTED   Enterobacter cloacae complex NOT DETECTED NOT DETECTED   Escherichia coli NOT DETECTED NOT DETECTED   Klebsiella oxytoca NOT DETECTED NOT DETECTED   Klebsiella pneumoniae NOT DETECTED NOT DETECTED   Proteus species NOT DETECTED NOT DETECTED   Serratia marcescens NOT DETECTED NOT DETECTED   Haemophilus influenzae NOT DETECTED NOT DETECTED   Neisseria meningitidis NOT DETECTED NOT DETECTED   Pseudomonas aeruginosa NOT DETECTED NOT DETECTED   Candida albicans NOT DETECTED NOT DETECTED   Candida glabrata NOT  DETECTED NOT DETECTED   Candida krusei NOT DETECTED NOT DETECTED   Candida parapsilosis NOT DETECTED NOT DETECTED   Candida tropicalis NOT DETECTED NOT DETECTED    Brain Hilts 07/04/2018  9:13 PM

## 2018-07-04 NOTE — Plan of Care (Signed)
Patient remains disoriented and Air cabin crew in place.

## 2018-07-04 NOTE — ED Notes (Signed)
Patient is confused continues to roll all over stretcher. Sheets changed and patient repositioned sitter at bedside.

## 2018-07-04 NOTE — Progress Notes (Addendum)
PROGRESS NOTE  Mitchell Scott VWU:[id number] DOB: April 02, 1940 DOA: 07/03/2018 PCP: System, Pcp Not In  HPI/Recap of past 24 hours: Mitchell Scott is a 78 y.o. male with medical history significant of recent SDH (02/2018), hypertension, hyperlipidemia, diabetes mellitus, CAD, who presents with fall, head injury and AMS.  Patient has AMS, not following command and is unable to provide accurate medical history, therefore, most of the history is obtained by discussing the case with ED physician, per EMS report, and with the nursing staff.  Per report, pt fell twice today, at 14:00 and 17:00. Not sure exactly what happened. Pt had head injury with left forehead hematoma.  He is confused, not following command.  He is reportedly oriented x3 at baseline.  Patient moves all extremities, no facial droop was or slurred speech.  No active cough, respiratory distress, nausea vomiting or diarrhea noted.  Not sure if patient has any chest pain or symptoms of UTI.  ED Course:  Chest x-ray showed cardiomegaly without infiltration.  CT of C-spine is negative for bony fracture, but showed a degenerative disc disease. CT-head showed left SDH which is slightly enlarged compared to recent image 6/7, no significant change in approximately 3 mm left right midline shift.  Patient is placed on stepdown bed for observation. Neurosurgeon, Dr. Newell Coral was consulted. COVID-19 positive.  07/04/18: Patient seen and examined at his bedside in the emergency department.  He is alert but confused and unable to provide a history.  UPDATE: Repeat head CT shows: Left-sided subdural hematoma slightly improved from yesterday. No new hemorrhage or mass-effect compared with yesterday.  We will transfer to [facility] to continue care.  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 Has had recurrent falls Repeat CT head done on admission showed left subdural hematoma slightly enlarged compared to prior on 06/12/2018 with 3 mm left to right midline shift. CT head planned this morning 07/04/2018 pending and 1 planned this afternoon. No chemical DVT prophylaxis Precautions Frequent neuro checks  Acute metabolic encephalopathy possibly likely to SDH Management as stated above. Reorient as needed UA negative  Covid-19 viral infection Independently reviewed chest x-ray done on admission which showed no active disease O2 saturation 100% on room air Elevated inflammatory markers, trend Start IV Solu-Medrol 40 mg BID, vitamin C, D3, zinc Repeat CT head this morning and this afternoon if negative can transfer to [facility] to continue care Continue to monitor O2 saturation  Uncontrolled hypertension Blood pressure goal normotensive Resume lisinopril 20 mg daily Continue to closely monitor vital signs  Ambulatory dysfunction with multiple falls PT/OT to assess Fall precautions  Hypercholesteremia: -Resume lipitor  Diabetes mellitus without complication (HCC): Last A1c is not on record. Patient is taking metformin and Humalog at home -SSI  CAD (coronary artery disease): Patient does not seem to have chest pain. -Hold Lipitor until mental state improves  Possible scabies: seems have scabies abdominal and groin area -try Permethrin cream  Risks: Patient is high risk for deterioration due to SDH post TBI, recurrent falls, acute metabolic encephalopathy, multiple comorbidities and advanced age.  Patient will require least 2 midnights for further evaluation and treatment of present condition.  DVT ppx: SCD Code Status: DNR Family Communication: None at bed  side.    Disposition Plan:  Transfer to [facility] to continue care.  Consults  called:  Dr. Newell CoralNudelman of neurosurgery   Objective: Vitals:   07/04/18 0700 07/04/18 0715 07/04/18 0730 07/04/18 0745  BP: 129/67 (!) 151/93 133/86 (!) 143/93  Pulse: 90 93 94 89  Resp: 19 12 18 16   Temp:      TempSrc:      SpO2: 100% 100% 96% 100%   No intake or output data in the 24 hours ending 07/04/18 0855 There were no vitals filed for this visit.  Exam:   General: 78 y.o. year-old male well developed well nourished.  Alert and confused.  Cardiovascular: Regular rate and rhythm with no rubs or gallops.  No thyromegaly or JVD noted.    Respiratory: Clear to auscultation with no wheezes or rales. Poor inspiratory effort.  Abdomen: Soft nondistended with normal bowel sounds x4 quadrants.  Musculoskeletal: No lower extremity edema. 2/4 pulses in all 4 extremities.   Data Reviewed: CBC: Recent Labs  Lab 07/03/18 1800 07/04/18 0224  WBC 11.4* 9.8  NEUTROABS 8.1*  --   HGB 13.2 12.4*  HCT 41.6 38.4*  MCV 85.2 85.3  PLT 300 271   Basic Metabolic Panel: Recent Labs  Lab 07/03/18 1800 07/04/18 0224 07/04/18 0324  NA 136 137 139  K 3.6 3.8 3.5  CL 98 99 101  CO2 26 26 26   GLUCOSE 337* 258* 226*  BUN 10 8 8   CREATININE 1.04 0.82 0.92  CALCIUM 9.0 8.7* 8.7*   GFR: CrCl cannot be calculated (Unknown ideal weight.). Liver Function Tests: Recent Labs  Lab 07/03/18 1800 07/04/18 0324  AST 26 22  ALT 26 22  ALKPHOS 111 93  BILITOT 0.8 1.0  PROT 7.0 6.3*  ALBUMIN 3.7 3.3*   No results for input(s): LIPASE, AMYLASE in the last 168 hours. No results for input(s): AMMONIA in the last 168 hours. Coagulation Profile: Recent Labs  Lab 07/04/18 0224  INR 1.2   Cardiac Enzymes: Recent Labs  Lab 07/04/18 0324  CKTOTAL 120   BNP (last 3 results) No results for input(s): PROBNP in the last 8760 hours. HbA1C: No results for input(s): HGBA1C in the last 72 hours. CBG: Recent Labs  Lab 07/03/18 1758 07/04/18 0159  GLUCAP 302* 270*   Lipid  Profile: Recent Labs    07/04/18 0230 07/04/18 0326  TRIG 78 74   Thyroid Function Tests: No results for input(s): TSH, T4TOTAL, FREET4, T3FREE, THYROIDAB in the last 72 hours. Anemia Panel: Recent Labs    07/04/18 0224 07/04/18 0324  FERRITIN 380* 345*   Urine analysis:    Component Value Date/Time   COLORURINE YELLOW 07/03/2018 1924   APPEARANCEUR HAZY (A) 07/03/2018 1924   LABSPEC 1.023 07/03/2018 1924   PHURINE 5.0 07/03/2018 1924   GLUCOSEU >=500 (A) 07/03/2018 1924   HGBUR NEGATIVE 07/03/2018 1924   BILIRUBINUR NEGATIVE 07/03/2018 1924   KETONESUR 5 (A) 07/03/2018 1924   PROTEINUR NEGATIVE 07/03/2018 1924   NITRITE NEGATIVE 07/03/2018 1924   LEUKOCYTESUR NEGATIVE 07/03/2018 1924   Sepsis Labs: @LABRCNTIP (procalcitonin:4,lacticidven:4)  ) Recent Results (from the past 240 hour(s))  SARS Coronavirus 2 (CEPHEID - [facility]), Hosp Order     Status: Abnormal   Collection Time: 07/03/18  7:50 PM   Specimen: Nasopharyngeal Swab  Result Value Ref Range Status   SARS Coronavirus 2 POSITIVE (A) NEGATIVE Final    Comment: RESULT CALLED TO, READ BACK BY AND VERIFIED WITH: RN  P JACSON @ 2140 07/03/18 BY S GEZAHEGN  (NOTE) If result is NEGATIVE SARS-CoV-2 target nucleic acids are NOT DETECTED. The SARS-CoV-2 RNA is generally detectable in upper and lower  respiratory specimens during the acute phase of infection. The lowest  concentration of SARS-CoV-2 viral copies this assay can detect is 250  copies / mL. A negative result does not preclude SARS-CoV-2 infection  and should not be used as the sole basis for treatment or other  patient management decisions.  A negative result may occur with  improper specimen collection / handling, submission of specimen other  than nasopharyngeal swab, presence of viral mutation(s) within the  areas targeted by this assay, and inadequate number of viral copies  (<250 copies / mL). A negative result must be  combined with clinical  observations, patient history, and epidemiological information. If result is POSITIVE SARS-CoV-2 target nucleic acids are DETECT ED. The SARS-CoV-2 RNA is generally detectable in upper and lower  respiratory specimens during the acute phase of infection.  Positive  results are indicative of active infection with SARS-CoV-2.  Clinical  correlation with patient history and other diagnostic information is  necessary to determine patient infection status.  Positive results do  not rule out bacterial infection or co-infection with other viruses. If result is PRESUMPTIVE POSTIVE SARS-CoV-2 nucleic acids MAY BE PRESENT.   A presumptive positive result was obtained on the submitted specimen  and confirmed on repeat testing.  While 2019 novel coronavirus  (SARS-CoV-2) nucleic acids may be present in the submitted sample  additional confirmatory testing may be necessary for epidemiological  and / or clinical management purposes  to differentiate between  SARS-CoV-2 and other Sarbecovirus currently known to infect humans.  If clinically indicated additional testing with an alternate test  methodology (LAB74 [id number]) is advised. The SARS-CoV-2 RNA is generally  detectable in upper and lower respiratory specimens during the acute  phase of infection. The expected result is Negative. Fact Sheet for Patients:  StrictlyIdeas.no Fact Sheet for Healthcare Providers: BankingDealers.co.za This test is not yet approved or cleared by the [place] FDA and has been authorized for detection and/or diagnosis of SARS-CoV-2 by FDA under an Emergency Use Authorization (EUA).  This EUA will remain in effect (meaning this test can be used) for the duration of the COVID-19 declaration under Section 564(b)(1) of the Act, 21 U.S.C. section 360bbb-3(b)(1), unless the authorization is terminated or revoked sooner. Performed at [facility], [place] [address], [place], [place] [address]   Culture, blood (Routine X 2) w Reflex to ID Panel     Status: None (Preliminary result)   Collection Time: 07/04/18  2:26 AM   Specimen: BLOOD  Result Value Ref Range Status   Specimen Description BLOOD LEFT ANTECUBITAL  Final   Special Requests   Final    BOTTLES DRAWN AEROBIC AND ANAEROBIC Blood Culture adequate volume Performed at [facility], [place] [address] [place], [place] [address]    Culture PENDING  Incomplete   Report Status PENDING  Incomplete      Studies: Ct Head Wo Contrast  Result Date: 07/03/2018 CLINICAL DATA:  Multiple falls, recent stroke EXAM: CT HEAD WITHOUT CONTRAST CT CERVICAL SPINE WITHOUT CONTRAST TECHNIQUE: Multidetector CT imaging of the head and cervical spine was performed following the standard protocol without intravenous contrast. Multiplanar CT image reconstructions of the cervical spine were also generated. COMPARISON:  06/12/2018, 06/05/2018 FINDINGS: CT HEAD FINDINGS Brain: There is a redemonstrated left hemispheric subdural  hematoma, which has slightly enlarged compared to prior examination dated 06/12/2018, with a new, high attenuation internal components. This now measures approximately 8 mm in maximum coronal thickness, previously 6 mm. There is no significant change in approximately 3 mm left right midline shift. Underlying small-vessel white matter disease and global volume loss. Vascular: No hyperdense vessel or unexpected calcification. Skull: Normal. Negative for fracture or focal lesion. Sinuses/Orbits: No acute finding. Other: Soft tissue contusion of the left frontal scalp. CT CERVICAL SPINE FINDINGS Alignment: Normal. Skull base and vertebrae: No acute fracture. No primary bone lesion or focal pathologic process. Soft tissues and spinal canal: No prevertebral fluid or swelling. No visible canal hematoma. Disc levels: Moderate to severe multilevel disc and facet degenerative disease, worst at C3-C4  and C5-C6. Upper chest: Negative. Other: None. IMPRESSION: 1. There is a redemonstrated left hemispheric subdural hematoma, which has slightly enlarged compared to prior examination dated 06/12/2018, with a new, high attenuation internal components. This now measures approximately 8 mm in maximum coronal thickness, previously 6 mm. There is no significant change in approximately 3 mm left right midline shift. Underlying small-vessel white matter disease and global volume loss. 2.  Soft tissue contusion of the left frontal scalp. 3. No fracture or static subluxation of the cervical spine. 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 Electronically Signed   By: Lauralyn PrimesAlex  Bibbey M.D.   On: 07/03/2018 18:30   Ct Cervical Spine Wo Contrast  Result Date: 07/03/2018 CLINICAL DATA:  Multiple falls, recent stroke EXAM: CT HEAD WITHOUT CONTRAST CT CERVICAL SPINE WITHOUT CONTRAST TECHNIQUE: Multidetector CT imaging of the head and cervical spine was performed following the standard protocol without intravenous contrast. Multiplanar CT image reconstructions of the cervical spine were also generated. COMPARISON:  06/12/2018, 06/05/2018 FINDINGS: CT HEAD FINDINGS Brain: There is a redemonstrated left hemispheric subdural hematoma, which has slightly enlarged compared to prior examination dated 06/12/2018, with a new, high attenuation internal components. This now measures approximately 8 mm in maximum coronal thickness, previously 6 mm. There is no significant change in approximately 3 mm left right midline shift. Underlying small-vessel white matter disease and global volume loss. Vascular: No hyperdense vessel or unexpected calcification. Skull: Normal. Negative for fracture or focal lesion. Sinuses/Orbits: No acute finding. Other: Soft tissue contusion of the left frontal scalp. CT CERVICAL SPINE FINDINGS Alignment: Normal. Skull base and vertebrae: No acute fracture. No primary  bone lesion or focal pathologic process. Soft tissues and spinal canal: No prevertebral fluid or swelling. No visible canal hematoma. 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 Upper chest: Negative. Other: None. IMPRESSION: 1. There is a redemonstrated left hemispheric subdural hematoma, which has slightly enlarged compared to prior examination dated 06/12/2018, with a new, high attenuation internal components. This now measures approximately 8 mm in maximum coronal thickness, previously 6 mm. There is no significant change in approximately 3 mm left right midline shift. Underlying small-vessel white matter disease and global volume loss. 2.  Soft tissue contusion of the left frontal scalp. 3. No fracture or static subluxation of the cervical spine. 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 Electronically Signed   By: Lauralyn PrimesAlex  Bibbey M.D.   On: 07/03/2018 18:30   Dg Chest Portable 1 View  Result Date: 07/03/2018 CLINICAL DATA:  Fall EXAM: PORTABLE CHEST 1 VIEW COMPARISON:  None. FINDINGS: Cardiomegaly. Lungs clear. No effusions or  edema. No acute bony abnormality. IMPRESSION: Cardiomegaly.  No active disease. Electronically Signed   By: Charlett NoseKevin  Dover M.D.   On: 07/03/2018 19:30    Scheduled Meds:  insulin aspart  0-5 Units Subcutaneous QHS   insulin aspart  0-9 Units Subcutaneous TID WC   permethrin   Topical Once    Continuous Infusions:   LOS: 0 days     Darlin Droparole N Dove Gresham, MD [facility] Pager [phone]  If 7PM-7AM, please contact night-coverage [email or web address] Password [email or web address] 07/04/2018, 8:55 AM

## 2018-07-04 NOTE — ED Notes (Signed)
ED TO INPATIENT HANDOFF REPORT  ED Nurse Name and Phone #: Lew Dawes RN [phone]  S Name/Age/Gender Mitchell Scott 78 y.o. male Room/Bed: 024C/024C  Code Status   Code Status: DNR  Home/SNF/Other Nursing Home Patient oriented to: confused Is this baseline? Yes   Triage Complete: Triage complete  Chief Complaint fall AMS weakness  Triage Note Pt arrived to ed via [facility] after a second fall today. First fall at approximately 1400 today with second fall at 1700. After second fall pt unable to follow commands. Pt baseline is AOx4. EMS reports GCS of 10.   Allergies Allergies  Allergen Reactions  . Codeine     Other reaction(s): GASTRIC UPSET  . Penicillins Itching and Rash    Other reaction(s): UNKNOWN    Level of Care/Admitting Diagnosis ED Disposition    ED Disposition Condition Comment   Admit  Hospital Area: [facility] [[id number]]  Level of Care: Progressive [102]  I expect the patient will be discharged within 24 hours: No (not a candidate for 5C-Observation unit)  Covid Evaluation: Confirmed COVID Negative  Diagnosis: SDH (subdural hematoma) [facility]) [[id number]]  Admitting Physician: Lorretta Harp [[id number]]  Attending Physician: Lorretta Harp [[id number]]  PT Class (Do Not Modify): Observation [104]  PT Acc Code (Do Not Modify): Observation [10022]       B Medical/Surgery History Past Medical History:  Diagnosis Date  . Diabetes mellitus without complication (HCC)   . Heart attack (HCC)   . Hypercholesteremia   . Hypertension   . Liver damage   . SDH (subdural hematoma) (HCC)    Past Surgical History:  Procedure Laterality Date  . TONSILLECTOMY       A IV Location/Drains/Wounds Patient Lines/Drains/Airways Status   Active Line/Drains/Airways    Name:   Placement date:   Placement time:   Site:   Days:   Peripheral IV 07/03/18 Left Antecubital   07/03/18    2205    Antecubital   1          Intake/Output Last 24 hours No intake or output data  in the 24 hours ending 07/04/18 0731  Labs/Imaging Results for orders placed or performed during the hospital encounter of 07/03/18 (from the past 48 hour(s))  CBG monitoring, ED     Status: Abnormal   Collection Time: 07/03/18  5:58 PM  Result Value Ref Range   Glucose-Capillary 302 (H) 70 - 99 mg/dL   Comment 1 Notify RN    Comment 2 Document in Chart   CBC with Differential     Status: Abnormal   Collection Time: 07/03/18  6:00 PM  Result Value Ref Range   WBC 11.4 (H) 4.0 - 10.5 K/uL   RBC 4.88 4.22 - 5.81 MIL/uL   Hemoglobin 13.2 13.0 - 17.0 g/dL   HCT 82.9 56.2 - 13.0 %   MCV 85.2 80.0 - 100.0 fL   MCH 27.0 26.0 - 34.0 pg   MCHC 31.7 30.0 - 36.0 g/dL   RDW 86.5 (H) 78.4 - 69.6 %   Platelets 300 150 - 400 K/uL   nRBC 0.0 0.0 - 0.2 %   Neutrophils Relative % 71 %   Neutro Abs 8.1 (H) 1.7 - 7.7 K/uL   Lymphocytes Relative 12 %   Lymphs Abs 1.3 0.7 - 4.0 K/uL   Monocytes Relative 10 %   Monocytes Absolute 1.1 (H) 0.1 - 1.0 K/uL   Eosinophils Relative 6 %   Eosinophils Absolute 0.7 (H) 0.0 - 0.5 K/uL  Basophils Relative 1 %   Basophils Absolute 0.1 0.0 - 0.1 K/uL   Immature Granulocytes 0 %   Abs Immature Granulocytes 0.04 0.00 - 0.07 K/uL    Comment: Performed at [facility], [address] [place], [place] [address]  Comprehensive metabolic panel     Status: Abnormal   Collection Time: 07/03/18  6:00 PM  Result Value Ref Range   Sodium 136 135 - 145 mmol/L   Potassium 3.6 3.5 - 5.1 mmol/L   Chloride 98 98 - 111 mmol/L   CO2 26 22 - 32 mmol/L   Glucose, Bld 337 (H) 70 - 99 mg/dL   BUN 10 8 - 23 mg/dL   Creatinine, Ser 4.091.04 0.61 - 1.24 mg/dL   Calcium 9.0 8.9 - 81.110.3 mg/dL   Total Protein 7.0 6.5 - 8.1 g/dL   Albumin 3.7 3.5 - 5.0 g/dL   AST 26 15 - 41 U/L   ALT 26 0 - 44 U/L   Alkaline Phosphatase 111 38 - 126 U/L   Total Bilirubin 0.8 0.3 - 1.2 mg/dL   GFR calc non Af Amer >60 >60 mL/min   GFR calc Af Amer >60 >60 mL/min   Anion gap 12 5 - 15     Comment: Performed at [facility], [address] [place], [place] [address]  Urinalysis, Routine w reflex microscopic     Status: Abnormal   Collection Time: 07/03/18  7:24 PM  Result Value Ref Range   Color, Urine YELLOW YELLOW   APPearance HAZY (A) CLEAR   Specific Gravity, Urine 1.023 1.005 - 1.030   pH 5.0 5.0 - 8.0   Glucose, UA >=500 (A) NEGATIVE mg/dL   Hgb urine dipstick NEGATIVE NEGATIVE   Bilirubin Urine NEGATIVE NEGATIVE   Ketones, ur 5 (A) NEGATIVE mg/dL   Protein, ur NEGATIVE NEGATIVE mg/dL   Nitrite NEGATIVE NEGATIVE   Leukocytes,Ua NEGATIVE NEGATIVE   RBC / HPF 0-5 0 - 5 RBC/hpf   WBC, UA 0-5 0 - 5 WBC/hpf   Bacteria, UA RARE (A) NONE SEEN   Squamous Epithelial / LPF 0-5 0 - 5   Mucus PRESENT    Ca Oxalate Crys, UA PRESENT     Comment: Performed at [facility], [address] [place], [place] [address]  SARS Coronavirus 2 (CEPHEID - Performed in [facility] hospital lab), Hosp Order     Status: Abnormal   Collection Time: 07/03/18  7:50 PM   Specimen: Nasopharyngeal Swab  Result Value Ref Range   SARS Coronavirus 2 POSITIVE (A) NEGATIVE    Comment: RESULT CALLED TO, READ BACK BY AND VERIFIED WITH: RN P JACSON @ 2140 07/03/18 BY S GEZAHEGN  (NOTE) If result is NEGATIVE SARS-CoV-2 target nucleic acids are NOT DETECTED. The SARS-CoV-2 RNA is generally detectable in upper and lower  respiratory specimens during the acute phase of infection. The lowest  concentration of SARS-CoV-2 viral copies this assay can detect is 250  copies / mL. A negative result does not preclude SARS-CoV-2 infection  and should not be used as the sole basis for treatment or other  patient management decisions.  A negative result may occur with  improper specimen collection / handling, submission of specimen other  than nasopharyngeal swab, presence of viral mutation(s) within the  areas targeted by this assay, and inadequate number of viral copies  (<250 copies / mL). A  negative result must be combined with clinical  observations, patient history, and  epidemiological information. If result is POSITIVE SARS-CoV-2 target nucleic acids are DETECT ED. The SARS-CoV-2 RNA is generally detectable in upper and lower  respiratory specimens during the acute phase of infection.  Positive  results are indicative of active infection with SARS-CoV-2.  Clinical  correlation with patient history and other diagnostic information is  necessary to determine patient infection status.  Positive results do  not rule out bacterial infection or co-infection with other viruses. If result is PRESUMPTIVE POSTIVE SARS-CoV-2 nucleic acids MAY BE PRESENT.   A presumptive positive result was obtained on the submitted specimen  and confirmed on repeat testing.  While 2019 novel coronavirus  (SARS-CoV-2) nucleic acids may be present in the submitted sample  additional confirmatory testing may be necessary for epidemiological  and / or clinical management purposes  to differentiate between  SARS-CoV-2 and other Sarbecovirus currently known to infect humans.  If clinically indicated additional testing with an alternate test  methodology (LAB74 [id number]) is advised. The SARS-CoV-2 RNA is generally  detectable in upper and lower respiratory specimens during the acute  phase of infection. The expected result is Negative. Fact Sheet for Patients:  StrictlyIdeas.no Fact Sheet for Healthcare Providers: BankingDealers.co.za This test is not yet approved or cleared by the [place] FDA and has been authorized for detection and/or diagnosis of SARS-CoV-2 by FDA under an Emergency Use Authorization (EUA).  This EUA will remain in effect (meaning this test can be used) for the duration of the COVID-19 declaration under Section 564(b)(1) of the Act, 21 U.S.C. section 360bbb-3(b)(1), unless the authorization is terminated or revoked sooner. Performed  at [facility], [place] [address] [place], [place] [address]   CBG monitoring, ED     Status: Abnormal   Collection Time: 07/04/18  1:59 AM  Result Value Ref Range   Glucose-Capillary 270 (H) 70 - 99 mg/dL  Basic metabolic panel     Status: Abnormal   Collection Time: 07/04/18  2:24 AM  Result Value Ref Range   Sodium 137 135 - 145 mmol/L   Potassium 3.8 3.5 - 5.1 mmol/L   Chloride 99 98 - 111 mmol/L   CO2 26 22 - 32 mmol/L   Glucose, Bld 258 (H) 70 - 99 mg/dL   BUN 8 8 - 23 mg/dL   Creatinine, Ser 0.82 0.61 - 1.24 mg/dL   Calcium 8.7 (L) 8.9 - 10.3 mg/dL   GFR calc non Af Amer >60 >60 mL/min   GFR calc Af Amer >60 >60 mL/min   Anion gap 12 5 - 15    Comment: Performed at [facility], [place] [address] [place], [place] [address]  CBC     Status: Abnormal   Collection Time: 07/04/18  2:24 AM  Result Value Ref Range   WBC 9.8 4.0 - 10.5 K/uL   RBC 4.50 4.22 - 5.81 MIL/uL   Hemoglobin 12.4 (L) 13.0 - 17.0 g/dL   HCT 38.4 (L) 39.0 - 52.0 %   MCV 85.3 80.0 - 100.0 fL   MCH 27.6 26.0 - 34.0 pg   MCHC 32.3 30.0 - 36.0 g/dL   RDW 16.4 (H) 11.5 - 15.5 %   Platelets 271 150 - 400 K/uL   nRBC 0.0 0.0 - 0.2 %    Comment: Performed at [facility], [place] [address] [place], [place] [address]  Protime-INR     Status: None   Collection Time: 07/04/18  2:24 AM  Result Value Ref Range  Prothrombin Time 14.6 11.4 - 15.2 seconds   INR 1.2 0.8 - 1.2    Comment: (NOTE) INR goal varies based on device and disease states. Performed at [facility], [address] [place], [place] [address]   APTT     Status: None   Collection Time: 07/04/18  2:24 AM  Result Value Ref Range   aPTT 30 24 - 36 seconds    Comment: Performed at [facility], [address] [place], [place] [address]  C-reactive protein     Status: None   Collection Time: 07/04/18  2:24 AM  Result Value Ref Range   CRP <0.8 <1.0 mg/dL    Comment: Performed at [facility], [address] [place], [place] [address]  D-dimer, quantitative (not at [facility])     Status: Abnormal   Collection Time: 07/04/18  2:24 AM  Result Value Ref Range   D-Dimer, Quant 1.56 (H) 0.00 - 0.50 ug/mL-FEU    Comment: (NOTE) At the manufacturer cut-off of 0.50 ug/mL FEU, this assay has been documented to exclude PE with a sensitivity and negative predictive value of 97 to 99%.  At this time, this assay has not been approved by the FDA to exclude DVT/VTE. Results should be correlated with clinical presentation. Performed at [facility], [address] [place], [place] [address]   Ferritin     Status: Abnormal   Collection Time: 07/04/18  2:24 AM  Result Value Ref Range   Ferritin 380 (H) 24 - 336 ng/mL    Comment: Performed at [facility], [address] [place], [place] [address]  Fibrinogen     Status: None   Collection Time: 07/04/18  2:24 AM  Result Value Ref Range   Fibrinogen 368 210 - 475 mg/dL    Comment: Performed at [facility], [address] [place], [place] [address]  Lactate dehydrogenase     Status: Abnormal   Collection Time: 07/04/18  2:24 AM  Result Value Ref Range   LDH 237 (H) 98 - 192 U/L    Comment: Performed at [facility], [address] [place], [place] [address]  Procalcitonin     Status: None   Collection Time: 07/04/18  2:24 AM  Result Value Ref Range   Procalcitonin <0.10 ng/mL    Comment:        Interpretation: PCT (Procalcitonin) <= 0.5 ng/mL: Systemic infection (sepsis) is not likely. Local bacterial infection is possible. (NOTE)       Sepsis PCT Algorithm           Lower Respiratory Tract                                      Infection PCT Algorithm    ----------------------------     ----------------------------         PCT < 0.25 ng/mL                PCT < 0.10 ng/mL         Strongly encourage             Strongly discourage   discontinuation of antibiotics    initiation of antibiotics    ----------------------------      -----------------------------       PCT 0.25 - 0.50 ng/mL  PCT 0.10 - 0.25 ng/mL               OR       >80% decrease in PCT            Discourage initiation of                                            antibiotics      Encourage discontinuation           of antibiotics    ----------------------------     -----------------------------         PCT >= 0.50 ng/mL              PCT 0.26 - 0.50 ng/mL               AND        <80% decrease in PCT             Encourage initiation of                                             antibiotics       Encourage continuation           of antibiotics    ----------------------------     -----------------------------        PCT >= 0.50 ng/mL                  PCT > 0.50 ng/mL               AND         increase in PCT                  Strongly encourage                                      initiation of antibiotics    Strongly encourage escalation           of antibiotics                                     -----------------------------                                           PCT <= 0.25 ng/mL                                                 OR                                        > 80% decrease in PCT  Discontinue / Do not initiate                                             antibiotics Performed at [facility], [address] [place], [place] [address]   Sedimentation rate     Status: None   Collection Time: 07/04/18  2:24 AM  Result Value Ref Range   Sed Rate 2 0 - 16 mm/hr    Comment: Performed at [facility], [address], [place], [place] [address]  Troponin I (High Sensitivity)     Status: None   Collection Time: 07/04/18  2:24 AM  Result Value Ref Range   Troponin I (High Sensitivity) 9 <18 ng/L    Comment: (NOTE) Elevated high sensitivity troponin I (hsTnI) values and significant  changes across serial measurements may suggest ACS but many other  chronic and acute  conditions are known to elevate hsTnI results.  Refer to the "Links" section for chest pain algorithms and additional  guidance. Performed at [facility], [address], [place], [place] [address]   Type and screen [facility] [place]     Status: None   Collection Time: 07/04/18  2:26 AM  Result Value Ref Range   ABO/RH(D) O POS    Antibody Screen NEG    Sample Expiration      07/07/2018,2359 Performed at [facility], [address] [place], [place] [address]   Brain natriuretic peptide     Status: None   Collection Time: 07/04/18  2:29 AM  Result Value Ref Range   B Natriuretic Peptide 39.1 0.0 - 100.0 pg/mL    Comment: Performed at [facility], [address], [place], [place] [address]  Triglycerides     Status: None   Collection Time: 07/04/18  2:30 AM  Result Value Ref Range   Triglycerides 78 <150 mg/dL    Comment: Performed at [facility], [address] [place], [place] [address]  Troponin I (High Sensitivity)     Status: None   Collection Time: 07/04/18  3:24 AM  Result Value Ref Range   Troponin I (High Sensitivity) 10 <18 ng/L    Comment: (NOTE) Elevated high sensitivity troponin I (hsTnI) values and significant  changes across serial measurements may suggest ACS but many other  chronic and acute conditions are known to elevate hsTnI results.  Refer to the "Links" section for chest pain algorithms and additional  guidance. Performed at [facility], [address] [place], [place] [address]   Comprehensive metabolic panel     Status: Abnormal   Collection Time: 07/04/18  3:24 AM  Result Value Ref Range   Sodium 139 135 - 145 mmol/L   Potassium 3.5 3.5 - 5.1 mmol/L   Chloride 101 98 - 111 mmol/L   CO2 26 22 - 32 mmol/L   Glucose, Bld 226 (H) 70 - 99 mg/dL   BUN 8 8 - 23 mg/dL   Creatinine, Ser 9.62 0.61 - 1.24 mg/dL   Calcium 8.7 (L) 8.9 - 10.3 mg/dL   Total Protein 6.3 (L) 6.5 - 8.1 g/dL   Albumin 3.3 (L) 3.5  - 5.0 g/dL   AST 22 15 - 41 U/L   ALT 22 0 - 44 U/L   Alkaline Phosphatase 93 38 - 126 U/L  Total Bilirubin 1.0 0.3 - 1.2 mg/dL   GFR calc non Af Amer >60 >60 mL/min   GFR calc Af Amer >60 >60 mL/min   Anion gap 12 5 - 15    Comment: Performed at [facility], [address], [place], [place] [address]  C-reactive protein     Status: None   Collection Time: 07/04/18  3:24 AM  Result Value Ref Range   CRP <0.8 <1.0 mg/dL    Comment: Performed at [facility], [address] [place], [place] [address]  CK     Status: None   Collection Time: 07/04/18  3:24 AM  Result Value Ref Range   Total CK 120 49 - 397 U/L    Comment: Performed at [facility], [address] [place], [place] [address]  D-dimer, quantitative (not at [facility])     Status: Abnormal   Collection Time: 07/04/18  3:24 AM  Result Value Ref Range   D-Dimer, Quant 1.43 (H) 0.00 - 0.50 ug/mL-FEU    Comment: (NOTE) At the manufacturer cut-off of 0.50 ug/mL FEU, this assay has been documented to exclude PE with a sensitivity and negative predictive value of 97 to 99%.  At this time, this assay has not been approved by the FDA to exclude DVT/VTE. Results should be correlated with clinical presentation. Performed at [facility], [address], [place], [place] [address]   Ferritin     Status: Abnormal   Collection Time: 07/04/18  3:24 AM  Result Value Ref Range   Ferritin 345 (H) 24 - 336 ng/mL    Comment: Performed at [facility], [address], [place], [place] [address]  Triglycerides     Status: None   Collection Time: 07/04/18  3:26 AM  Result Value Ref Range   Triglycerides 74 <150 mg/dL    Comment: Performed at [facility], [address] [place], [place] [address]   Ct Head Wo Contrast  Result Date: 07/03/2018 CLINICAL DATA:  Multiple falls, recent stroke EXAM: CT HEAD WITHOUT CONTRAST CT CERVICAL SPINE WITHOUT CONTRAST TECHNIQUE: Multidetector CT imaging of the head  and cervical spine was performed following the standard protocol without intravenous contrast. Multiplanar CT image reconstructions of the cervical spine were also generated. COMPARISON:  06/12/2018, 06/05/2018 FINDINGS: CT HEAD FINDINGS Brain: There is a redemonstrated left hemispheric subdural hematoma, which has slightly enlarged compared to prior examination dated 06/12/2018, with a new, high attenuation internal components. This now measures approximately 8 mm in maximum coronal thickness, previously 6 mm. There is no significant change in approximately 3 mm left right midline shift. Underlying small-vessel white matter disease and global volume loss. Vascular: No hyperdense vessel or unexpected calcification. Skull: Normal. Negative for fracture or focal lesion. Sinuses/Orbits: No acute finding. Other: Soft tissue contusion of the left frontal scalp. CT CERVICAL SPINE FINDINGS Alignment: Normal. Skull base and vertebrae: No acute fracture. No primary bone lesion or focal pathologic process. Soft tissues and spinal canal: No prevertebral fluid or swelling. No visible canal hematoma. 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 Upper chest: Negative. Other: None. IMPRESSION: 1. There is a redemonstrated left hemispheric subdural hematoma, which has slightly enlarged compared to prior examination dated 06/12/2018, with a new, high attenuation internal components. This now measures approximately 8 mm in maximum coronal thickness, previously 6 mm. There is no significant change in approximately 3 mm left right  midline shift. Underlying small-vessel white matter disease and global volume loss. 2.  Soft tissue contusion of the left frontal scalp. 3. No fracture or static subluxation of the cervical spine. 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 Electronically Signed   By: Lauralyn PrimesAlex  Bibbey M.D.   On:  07/03/2018 18:30   Ct Cervical Spine Wo Contrast  Result Date: 07/03/2018 CLINICAL DATA:  Multiple falls, recent stroke EXAM: CT HEAD WITHOUT CONTRAST CT CERVICAL SPINE WITHOUT CONTRAST TECHNIQUE: Multidetector CT imaging of the head and cervical spine was performed following the standard protocol without intravenous contrast. Multiplanar CT image reconstructions of the cervical spine were also generated. COMPARISON:  06/12/2018, 06/05/2018 FINDINGS: CT HEAD FINDINGS Brain: There is a redemonstrated left hemispheric subdural hematoma, which has slightly enlarged compared to prior examination dated 06/12/2018, with a new, high attenuation internal components. This now measures approximately 8 mm in maximum coronal thickness, previously 6 mm. There is no significant change in approximately 3 mm left right midline shift. Underlying small-vessel white matter disease and global volume loss. Vascular: No hyperdense vessel or unexpected calcification. Skull: Normal. Negative for fracture or focal lesion. Sinuses/Orbits: No acute finding. Other: Soft tissue contusion of the left frontal scalp. CT CERVICAL SPINE FINDINGS Alignment: Normal. Skull base and vertebrae: No acute fracture. No primary bone lesion or focal pathologic process. Soft tissues and spinal canal: No prevertebral fluid or swelling. No visible canal hematoma. 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 Upper chest: Negative. Other: None. IMPRESSION: 1. There is a redemonstrated left hemispheric subdural hematoma, which has slightly enlarged compared to prior examination dated 06/12/2018, with a new, high attenuation internal components. This now measures approximately 8 mm in maximum coronal thickness, previously 6 mm. There is no significant change in approximately 3 mm left right midline shift. Underlying small-vessel white matter disease and global volume loss. 2.  Soft tissue contusion of  the left frontal scalp. 3. No fracture or static subluxation of the cervical spine. 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 Electronically Signed   By: Lauralyn PrimesAlex  Bibbey M.D.   On: 07/03/2018 18:30   Dg Chest Portable 1 View  Result Date: 07/03/2018 CLINICAL DATA:  Fall EXAM: PORTABLE CHEST 1 VIEW COMPARISON:  None. FINDINGS: Cardiomegaly. Lungs clear. No effusions or edema. No acute bony abnormality. IMPRESSION: Cardiomegaly.  No active disease. Electronically Signed   By: Charlett NoseKevin  Dover M.D.   On: 07/03/2018 19:30    Pending Labs Unresulted Labs (From admission, onward)    Start     Ordered   07/04/18 0500  Comprehensive metabolic panel  Daily,   R     07/04/18 0135   07/04/18 0500  C-reactive protein  Daily,   R     07/04/18 0135   07/04/18 0500  CK  Daily,   R     07/04/18 0135   07/04/18 0500  D-dimer, quantitative (not at [facility])  Daily,   R     07/04/18 0135   07/04/18 0500  Ferritin  Daily,   R     07/04/18 0135   07/04/18 0500  Triglycerides  Daily,   R     07/04/18 0135   07/04/18 0500  Interleukin-6, Plasma  Daily,   R     07/04/18 0135   07/04/18 0226  ABO/Rh  Once,   R     07/04/18 [id number]  07/04/18 0136  Respiratory Panel by PCR  Add-on,   AD     07/04/18 0135   07/04/18 0136  Influenza panel by PCR (type A & B)  Add-on,   AD     07/04/18 0135   07/04/18 0136  Hepatitis B surface antigen  Once,   STAT     07/04/18 0135   07/04/18 0136  Interleukin-6, Plasma  Once,   STAT     07/04/18 0135   07/04/18 0135  Culture, blood (Routine X 2) w Reflex to ID Panel  BLOOD CULTURE X 2,   R (with STAT occurrences)    Comments: Please obtain prior to antibiotic administration.    07/04/18 0135   07/03/18 1802  Urine culture  ONCE - STAT,   STAT     07/03/18 1802          Vitals/Pain Today's Vitals   07/04/18 0614 07/04/18 0630 07/04/18 0645 07/04/18 0700  BP:  (!) 146/79 (!) 145/78 129/67  Pulse:  91 78 90  Resp:  17 14 19    Temp: 97.7 F (36.5 C)     TempSrc: Oral     SpO2:  97% 100% 100%  PainSc:        Isolation Precautions Airborne and Contact precautions  Medications Medications  hydrALAZINE (APRESOLINE) injection 5 mg (has no administration in time range)  ondansetron (ZOFRAN) injection 4 mg (has no administration in time range)  acetaminophen (TYLENOL) tablet 650 mg (has no administration in time range)    Or  acetaminophen (TYLENOL) suppository 650 mg (has no administration in time range)  insulin aspart (novoLOG) injection 0-9 Units (has no administration in time range)  insulin aspart (novoLOG) injection 0-5 Units (3 Units Subcutaneous Given 07/04/18 0229)  levalbuterol (XOPENEX HFA) inhaler 2 puff (has no administration in time range)  permethrin (ELIMITE) 5 % cream (has no administration in time range)  LORazepam (ATIVAN) injection 2 mg (2 mg Intravenous Given 07/04/18 0646)    Mobility non-ambulatory High fall risk   Focused Assessments trauma   R Recommendations: See Admitting Provider Note  Report given to:   Additional Notes:

## 2018-07-05 DIAGNOSIS — S065X9A Traumatic subdural hemorrhage with loss of consciousness of unspecified duration, initial encounter: Principal | ICD-10-CM

## 2018-07-05 LAB — MRSA PCR SCREENING: MRSA by PCR: POSITIVE — AB

## 2018-07-05 LAB — CBC WITH DIFFERENTIAL/PLATELET
Abs Immature Granulocytes: 0.02 10*3/uL (ref 0.00–0.07)
Basophils Absolute: 0 10*3/uL (ref 0.0–0.1)
Basophils Relative: 0 %
Eosinophils Absolute: 0 10*3/uL (ref 0.0–0.5)
Eosinophils Relative: 0 %
HCT: 43.1 % (ref 39.0–52.0)
Hemoglobin: 13.8 g/dL (ref 13.0–17.0)
Immature Granulocytes: 0 %
Lymphocytes Relative: 5 %
Lymphs Abs: 0.5 10*3/uL — ABNORMAL LOW (ref 0.7–4.0)
MCH: 27.7 pg (ref 26.0–34.0)
MCHC: 32 g/dL (ref 30.0–36.0)
MCV: 86.5 fL (ref 80.0–100.0)
Monocytes Absolute: 0.3 10*3/uL (ref 0.1–1.0)
Monocytes Relative: 3 %
Neutro Abs: 8.7 10*3/uL — ABNORMAL HIGH (ref 1.7–7.7)
Neutrophils Relative %: 92 %
Platelets: 321 10*3/uL (ref 150–400)
RBC: 4.98 MIL/uL (ref 4.22–5.81)
RDW: 16.6 % — ABNORMAL HIGH (ref 11.5–15.5)
WBC: 9.6 10*3/uL (ref 4.0–10.5)
nRBC: 0 % (ref 0.0–0.2)

## 2018-07-05 LAB — HEPATITIS B SURFACE ANTIGEN: Hepatitis B Surface Ag: NEGATIVE

## 2018-07-05 LAB — GLUCOSE, CAPILLARY
Glucose-Capillary: 205 mg/dL — ABNORMAL HIGH (ref 70–99)
Glucose-Capillary: 182 mg/dL — ABNORMAL HIGH (ref 70–99)
Glucose-Capillary: 410 mg/dL — ABNORMAL HIGH (ref 70–99)
Glucose-Capillary: 191 mg/dL — ABNORMAL HIGH (ref 70–99)

## 2018-07-05 LAB — INTERLEUKIN-6, PLASMA: Interleukin-6, Plasma: 31.5 pg/mL — ABNORMAL HIGH (ref 0.0–12.2)

## 2018-07-05 MED ORDER — INSULIN ASPART 100 UNIT/ML ~~LOC~~ SOLN
0.0000 [IU] | Freq: Every day | SUBCUTANEOUS | Status: DC
  Administered 2018-07-07: 5 [IU] via SUBCUTANEOUS

## 2018-07-05 MED ORDER — ATORVASTATIN CALCIUM 40 MG PO TABS
40.0000 mg | ORAL_TABLET | Freq: Every day | ORAL | Status: DC
  Administered 2018-07-05 – 2018-07-07 (×3): 40 mg via ORAL
  Filled 2018-07-05 (×3): qty 1

## 2018-07-05 MED ORDER — VITAMIN C 500 MG PO TABS
1000.0000 mg | ORAL_TABLET | Freq: Every day | ORAL | Status: DC
  Administered 2018-07-06 – 2018-07-08 (×3): 1000 mg via ORAL
  Filled 2018-07-05 (×3): qty 2

## 2018-07-05 MED ORDER — INSULIN ASPART 100 UNIT/ML ~~LOC~~ SOLN
0.0000 [IU] | Freq: Three times a day (TID) | SUBCUTANEOUS | Status: DC
  Administered 2018-07-06: 4 [IU] via SUBCUTANEOUS
  Administered 2018-07-06: 20 [IU] via SUBCUTANEOUS
  Administered 2018-07-06: 11 [IU] via SUBCUTANEOUS
  Administered 2018-07-07: 7 [IU] via SUBCUTANEOUS
  Administered 2018-07-07: 15 [IU] via SUBCUTANEOUS
  Administered 2018-07-07: 12:00:00 7 [IU] via SUBCUTANEOUS
  Administered 2018-07-08: 4 [IU] via SUBCUTANEOUS
  Administered 2018-07-08: 11 [IU] via SUBCUTANEOUS

## 2018-07-05 MED ORDER — PIPERACILLIN-TAZOBACTAM 3.375 G IVPB
3.3750 g | Freq: Three times a day (TID) | INTRAVENOUS | Status: DC
  Administered 2018-07-05: 3.375 g via INTRAVENOUS
  Filled 2018-07-05 (×2): qty 50

## 2018-07-05 MED ORDER — DIPHENHYDRAMINE HCL 25 MG PO CAPS
25.0000 mg | ORAL_CAPSULE | Freq: Three times a day (TID) | ORAL | Status: DC | PRN
  Administered 2018-07-05: 25 mg via ORAL
  Filled 2018-07-05: qty 1

## 2018-07-05 MED ORDER — INSULIN ASPART 100 UNIT/ML ~~LOC~~ SOLN
20.0000 [IU] | Freq: Once | SUBCUTANEOUS | Status: AC
  Administered 2018-07-05: 20 [IU] via SUBCUTANEOUS

## 2018-07-05 NOTE — Progress Notes (Signed)
Called to give report to [facility], RN unable to take my call at this time.

## 2018-07-05 NOTE — Progress Notes (Signed)
Pharmacy Antibiotic Note  Mitchell Scott is a 78 y.o. male admitted on 07/03/2018 with pneumonia.  Pharmacy has been consulted for zosyn dosing.  Plan: Zosyn 3.375g IV q8h (4 hour infusion). F/u cultures and clinical course    Temp (24hrs), Avg:97.9 F (36.6 C), Min:97.7 F (36.5 C), Max:98.3 F (36.8 C)  Recent Labs  Lab 07/03/18 1800 07/04/18 0224 07/04/18 0324  WBC 11.4* 9.8  --   CREATININE 1.04 0.82 0.92    CrCl cannot be calculated (Unknown ideal weight.).    Allergies  Allergen Reactions  . Codeine     Other reaction(s): GASTRIC UPSET  . Penicillins Itching and Rash    Other reaction(s): UNKNOWN, listed on MAR     Thank you for allowing pharmacy to be a part of this patient's care.  Excell Seltzer Poteet 07/05/2018 5:28 AM

## 2018-07-05 NOTE — Progress Notes (Signed)
pts cbg [phone] notified md. Orders recd.

## 2018-07-05 NOTE — Progress Notes (Addendum)
PROGRESS NOTE  Mitchell Scott GMW:[id number] DOB: 04/21/1940 DOA: 07/03/2018 PCP: System, Pcp Not In  HPI/Recap of past 24 hours: Mitchell Scott is a 78 y.o. male with medical history significant of recent SDH (02/2018), hypertension, hyperlipidemia, type 2 diabetes mellitus, CAD, who presents with fall, head injury and AMS.  Per report, pt fell twice prior to presentation to the ED. Not sure exactly what happened. Pt had head injury with left forehead hematoma.  He is confused. He is reportedly oriented x3 at baseline.  Moves all extremities, no facial droop was or slurred speech. Chest x-ray showed cardiomegaly without infiltration.  CT of C-spine is negative for bony fracture, but showed a degenerative disc disease. CT-head showed left SDH which is slightly enlarged compared to recent image 6/7, no significant change in approximately 3 mm left right midline shift.  Patient placed on stepdown bed for observation. Neurosurgeon, Dr. Newell CoralNudelman was consulted. COVID-19 positive.  Repeat CT done on 07/04/2018 stable with slight improvement of his SDH from the day before.  No new hemorrhage or mass-effect compared with yesterday.  [facility]  07/05/18: Patient seen and examined at his bedside.  He is alert and irritable and wants to be left alone.  Bacteremia noted this morning with 2 out of 4 positive blood cultures.  Started on Zosyn empirically, patient has an allergy to penicillin.   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 Has had recurrent falls, x2 on the day of presentation. Repeat CT head done on admission showed left subdural hematoma slightly enlarged compared to  prior on 06/12/2018 with 3 mm left to right midline shift. Repeated CT head 07/04/2018 showed slight improvement of his SDH from the day before. No chemical DVT prophylaxis per neurosurgery. He has SCDs in place. Fall Precautions Frequent neuro checks Reorient as needed Per neurosurgery, no role for neurosurgical intervention at this time.  Patient will need to follow-up regarding the SDH with his established [facility]. Ultimately follow-up with [facility].  VTE prophylaxis should be mechanical (no pharmacologic VTE prophylaxis).  Gram-positive cocci, gram-positive rods bacteremia 2/4 bottles positive from blood drawn on 07/04/2018 for the above He has an allergy to penicillin Started on Zosyn empirically 07/05/18 Awaiting bugs ID and results of sensitivity Repeat blood cultures tomorrow 07/06/2018. Urine culture no growth Monitor fever curve and WBC Obtain CBC with differentials tomorrow Poor dentition, no clear source of etiology of bacteremia  Acute metabolic encephalopathy possibly secondary to bacteremia versus SDH.  Management as stated above. Treat underlying condition UA negative, urine culture no growth. Reorient as needed  Covid-19 viral infection Independently reviewed chest x-ray done on admission which showed no active disease O2 saturation 100% on room air Elevated inflammatory markers, trend Continue IV Solu-Medrol 40 mg BID, vitamin C, D3, zinc D-dimer and ferritin trending down, CRP unchanged.  Procalcitonin less than 0.10. [facility]  Type 2 diabetes with hyperglycemia likely exacerbated by IV steroids Obtain Hemoglobin A1c Continue to hold oral antiglycemics Continue insulin sliding scale  Uncontrolled hypertension, improving Blood pressure is normotensive Stopped home lisinopril 20 mg daily due to [employer]ovid-19  infection and started norvasc 5 mg daily. Continue to closely  monitor vital signs  Ambulatory dysfunction with multiple falls PT/OT to assess Fall precautions  Hypercholesteremia: -Resume Lipitor at 40 mg daily. Home dose is 80 mg daily. -LFTs normal  CAD (coronary artery disease): Patient does not seem to have chest pain. -Hold Lipitor until mental state improves  Possible scabies: seems have scabies abdominal and groin area -Continue permethrin cream  Risks: High risk for decompensation due to bacteremia, COVID-19 viral infection, acute metabolic encephalopathy, multiple comorbidities and advanced age.  The patient will require least 2 midnights for further evaluation and treatment of present condition.    DVT ppx: SCD Code Status: DNR Family Communication: None at bed side.    Disposition Plan:   [facility]  Consults called:  Dr. Newell CoralNudelman of neurosurgery   Objective: Vitals:   07/04/18 1642 07/04/18 2053 07/05/18 0125 07/05/18 0641  BP: (!) 154/75  128/80   Pulse: 76     Resp: 11     Temp: 97.9 F (36.6 C)  98.3 F (36.8 C) 98.4 F (36.9 C)  TempSrc: Axillary  Axillary Oral  SpO2: 96% 97%      Intake/Output Summary (Last 24 hours) at 07/05/2018 0831 Last data filed at 07/05/2018 0641 Gross per 24 hour  Intake -  Output 1150 ml  Net -1150 ml   There were no vitals filed for this visit.  Exam:  . General: 78 y.o. year-old male well-developed well-nourished in no acute distress.  Alert and confused.   . Cardiovascular: Regular rate and rhythm with no rubs or gallops no JVD or thyromegaly noted.   Marland Kitchen. Respiratory: Clear to auscultation with no wheezes or rales.  Poor inspiratory effort. . Abdomen: Soft nontender nondistended with normal bowel sounds x4. . Musculoskeletal: No lower extremity edema.  2/4 pulses in all 4 extremities. . Mood: Mood is irritable.   Data Reviewed: CBC: Recent Labs  Lab 07/03/18 1800 07/04/18 0224  WBC 11.4* 9.8  NEUTROABS 8.1*  --   HGB 13.2 12.4*  HCT 41.6  38.4*  MCV 85.2 85.3  PLT 300 271   Basic Metabolic Panel: Recent Labs  Lab 07/03/18 1800 07/04/18 0224 07/04/18 0324  NA 136 137 139  K 3.6 3.8 3.5  CL 98 99 101  CO2 26 26 26   GLUCOSE 337* 258* 226*  BUN 10 8 8   CREATININE 1.04 0.82 0.92  CALCIUM 9.0 8.7* 8.7*   GFR: CrCl cannot be calculated (Unknown ideal weight.). Liver Function Tests: Recent Labs  Lab 07/03/18 1800 07/04/18 0324  AST 26 22  ALT 26 22  ALKPHOS 111 93  BILITOT 0.8 1.0  PROT 7.0 6.3*  ALBUMIN 3.7 3.3*   No results for input(s): LIPASE, AMYLASE in the last 168 hours. No results for input(s): AMMONIA in the last 168 hours. Coagulation Profile: Recent Labs  Lab 07/04/18 0224  INR 1.2   Cardiac Enzymes: Recent Labs  Lab 07/04/18 0324  CKTOTAL 120   BNP (last 3 results) No results for input(s): PROBNP in the last 8760 hours. HbA1C: No results for input(s): HGBA1C in the last 72 hours. CBG: Recent Labs  Lab 07/03/18 1758 07/04/18 0159 07/04/18 1047 07/04/18 1639 07/04/18 2132  GLUCAP 302* 270* 193* 148* 175*   Lipid Profile: Recent Labs    07/04/18 0230 07/04/18 0326  TRIG 78 74   Thyroid Function Tests: No results for input(s): TSH, T4TOTAL, FREET4, T3FREE, THYROIDAB in the last 72 hours.  Anemia Panel: Recent Labs    07/04/18 0224 07/04/18 0324  FERRITIN 380* 345*   Urine analysis:    Component Value Date/Time   COLORURINE YELLOW 07/03/2018 1924   APPEARANCEUR HAZY (A) 07/03/2018 1924   LABSPEC 1.023 07/03/2018 1924   PHURINE 5.0 07/03/2018 1924   GLUCOSEU >=500 (A) 07/03/2018 1924   HGBUR NEGATIVE 07/03/2018 [place] NEGATIVE 07/03/2018 1924   KETONESUR 5 (A) 07/03/2018 1924   PROTEINUR NEGATIVE 07/03/2018 1924   NITRITE NEGATIVE 07/03/2018 1924   LEUKOCYTESUR NEGATIVE 07/03/2018 1924   Sepsis Labs: @LABRCNTIP (procalcitonin:4,lacticidven:4)  ) Recent Results (from the past 240 hour(s))  Urine culture     Status: None   Collection Time: 07/03/18   7:14 PM   Specimen: Urine, Random  Result Value Ref Range Status   Specimen Description URINE, RANDOM  Final   Special Requests NONE  Final   Culture   Final    NO GROWTH Performed at [facility], [place] [address] [place], [place] [address]    Report Status 07/04/2018 FINAL  Final  SARS Coronavirus 2 (CEPHEID - Performed in [place] hospital lab), Hosp Order     Status: Abnormal   Collection Time: 07/03/18  7:50 PM   Specimen: Nasopharyngeal Swab  Result Value Ref Range Status   SARS Coronavirus 2 POSITIVE (A) NEGATIVE Final    Comment: RESULT CALLED TO, READ BACK BY AND VERIFIED WITH: RN P JACSON @ 2140 07/03/18 BY S GEZAHEGN  (NOTE) If result is NEGATIVE SARS-CoV-2 target nucleic acids are NOT DETECTED. The SARS-CoV-2 RNA is generally detectable in upper and lower  respiratory specimens during the acute phase of infection. The lowest  concentration of SARS-CoV-2 viral copies this assay can detect is 250  copies / mL. A negative result does not preclude SARS-CoV-2 infection  and should not be used as the sole basis for treatment or other  patient management decisions.  A negative result may occur with  improper specimen collection / handling, submission of specimen other  than nasopharyngeal swab, presence of viral mutation(s) within the  areas targeted by this assay, and inadequate number of viral copies  (<250 copies / mL). A negative result must be combined with clinical  observations, patient history, and epidemiological information. If result is POSITIVE SARS-CoV-2 target nucleic acids are DETECT ED. The SARS-CoV-2 RNA is generally detectable in upper and lower  respiratory specimens during the acute phase of infection.  Positive  results are indicative of active infection with SARS-CoV-2.  Clinical  correlation with patient history and other diagnostic information is  necessary to determine patient infection status.  Positive results do  not rule out bacterial  infection or co-infection with other viruses. If result is PRESUMPTIVE POSTIVE SARS-CoV-2 nucleic acids MAY BE PRESENT.   A presumptive positive result was obtained on the submitted specimen  and confirmed on repeat testing.  While 2019 novel coronavirus  (SARS-CoV-2) nucleic acids may be present in the submitted sample  additional confirmatory testing may be necessary for epidemiological  and / or clinical management purposes  to differentiate between  SARS-CoV-2 and other Sarbecovirus currently known to infect humans.  If clinically indicated additional testing with an alternate test  methodology (LAB74 53) is advised. The SARS-CoV-2 RNA is generally  detectable in upper and lower respiratory specimens during the acute  phase of infection. The expected result is Negative. Fact Sheet for Patients:  StrictlyIdeas.no Fact Sheet for Healthcare Providers: BankingDealers.co.za This test is not yet approved  or cleared by the [place] and has been authorized for detection and/or diagnosis of SARS-CoV-2 by FDA under an Emergency Use Authorization (EUA).  This EUA will remain in effect (meaning this test can be used) for the duration of the COVID-19 declaration under Section 564(b)(1) of the Act, 21 U.S.C. section 360bbb-3(b)(1), unless the authorization is terminated or revoked sooner. Performed at [facility], [address], [place], [place] [address]   Culture, blood (Routine X 2) w Reflex to ID Panel     Status: None (Preliminary result)   Collection Time: 07/04/18  2:26 AM   Specimen: BLOOD  Result Value Ref Range Status   Specimen Description BLOOD LEFT ANTECUBITAL  Final   Special Requests   Final    BOTTLES DRAWN AEROBIC AND ANAEROBIC Blood Culture adequate volume   Culture  Setup Time   Final    ANAEROBIC BOTTLE ONLY GRAM POSITIVE COCCI CRITICAL RESULT CALLED TO, READ BACK BY AND VERIFIED WITH: J MILLEN PHARMD  07/04/18 2107 JDW AEROBIC BOTTLE ONLY GRAM POSITIVE RODS CRITICAL RESULT CALLED TO, READ BACK BY AND VERIFIED WITHSudie Grumbling PHARMD 07/04/18 2341 JDW Performed at [facility], [address], [place], [place] [address]    Culture GRAM POSITIVE COCCI  Final   Report Status PENDING  Incomplete  Blood Culture ID Panel (Reflexed)     Status: Abnormal   Collection Time: 07/04/18  2:26 AM  Result Value Ref Range Status   Enterococcus species NOT DETECTED NOT DETECTED Final   Listeria monocytogenes NOT DETECTED NOT DETECTED Final   Staphylococcus species DETECTED (A) NOT DETECTED Final    Comment: Methicillin (oxacillin) susceptible coagulase negative staphylococcus. Possible blood culture contaminant (unless isolated from more than one blood culture draw or clinical case suggests pathogenicity). No antibiotic treatment is indicated for blood  culture contaminants. CRITICAL RESULT CALLED TO, READ BACK BY AND VERIFIED WITH: Ihor [facility] 07/04/18 2107 JDW    Staphylococcus aureus (BCID) NOT DETECTED NOT DETECTED Final   Methicillin resistance NOT DETECTED NOT DETECTED Final   Streptococcus species NOT DETECTED NOT DETECTED Final   Streptococcus agalactiae NOT DETECTED NOT DETECTED Final   Streptococcus pneumoniae NOT DETECTED NOT DETECTED Final   Streptococcus pyogenes NOT DETECTED NOT DETECTED Final   Acinetobacter baumannii NOT DETECTED NOT DETECTED Final   Enterobacteriaceae species NOT DETECTED NOT DETECTED Final   Enterobacter cloacae complex NOT DETECTED NOT DETECTED Final   Escherichia coli NOT DETECTED NOT DETECTED Final   Klebsiella oxytoca NOT DETECTED NOT DETECTED Final   Klebsiella pneumoniae NOT DETECTED NOT DETECTED Final   Proteus species NOT DETECTED NOT DETECTED Final   Serratia marcescens NOT DETECTED NOT DETECTED Final   Haemophilus influenzae NOT DETECTED NOT DETECTED Final   Neisseria meningitidis NOT DETECTED NOT DETECTED Final   Pseudomonas aeruginosa NOT  DETECTED NOT DETECTED Final   Candida albicans NOT DETECTED NOT DETECTED Final   Candida glabrata NOT DETECTED NOT DETECTED Final   Candida krusei NOT DETECTED NOT DETECTED Final   Candida parapsilosis NOT DETECTED NOT DETECTED Final   Candida tropicalis NOT DETECTED NOT DETECTED Final    Comment: Performed at [facility], [address], [place], [place] [address]  Culture, blood (Routine X 2) w Reflex to ID Panel     Status: None (Preliminary result)   Collection Time: 07/04/18  2:29 AM   Specimen: BLOOD RIGHT HAND  Result Value Ref Range Status   Specimen Description BLOOD RIGHT HAND  Final   Special Requests   Final    BOTTLES DRAWN AEROBIC AND ANAEROBIC Blood Culture results may not be optimal due to an inadequate volume of blood received in culture bottles   Culture   Final    NO GROWTH 1 DAY Performed at [facility], [address] [place], [place] [address]    Report Status PENDING  Incomplete      Studies: Ct Head Wo Contrast  Result Date: 07/04/2018 CLINICAL DATA:  Subdural hemorrhage follow-up EXAM: CT HEAD WITHOUT CONTRAST TECHNIQUE: Contiguous axial images were obtained from the base of the skull through the vertex without intravenous contrast. COMPARISON:  CT head 07/03/2018 FINDINGS: Brain: Left subdural hematoma slightly improved. No new area of hemorrhage since yesterday. No midline shift. Generalized atrophy with chronic white matter ischemia. Chronic ischemia in the thalamus bilaterally. No acute infarct or mass Vascular: Negative for hyperdense vessel. Atherosclerotic calcification cavernous carotid bilaterally. Skull: Negative Sinuses/Orbits: Mild mucosal edema paranasal sinuses. Negative orbit Other: None IMPRESSION: Left-sided subdural hematoma slightly improved from yesterday. No new hemorrhage or mass-effect compared with yesterday. Electronically Signed   By: Marlan Palauharles  Clark M.D.   On: 07/04/2018 15:24    Scheduled Meds: . amLODipine  5 mg Oral  Daily  . cholecalciferol  1,000 Units Oral Daily  . insulin aspart  0-5 Units Subcutaneous QHS  . insulin aspart  0-9 Units Subcutaneous TID WC  . methylPREDNISolone (SOLU-MEDROL) injection  40 mg Intravenous Daily  . vitamin C  1,000 mg Oral Daily  . zinc sulfate  220 mg Oral Daily    Continuous Infusions: . piperacillin-tazobactam (ZOSYN)  IV 3.375 g (07/05/18 0636)     LOS: 1 day     Darlin Droparole N Fritzie Prioleau, MD [facility] Pager [phone]  If 7PM-7AM, please contact night-coverage [email or web address] Password [email or web address] 07/05/2018, 8:31 AM

## 2018-07-05 NOTE — Progress Notes (Signed)
CRITICAL VALUE ALERT  Critical Value:  MRSA nasal swab positive  Date & Time Notied:  07/05/2018  Provider Notified: Curly Rim MD  Orders Received/Actions taken:

## 2018-07-05 NOTE — Progress Notes (Signed)
OT Cancellation Note  Patient Details Name: Mitchell Scott MRN: [id number] DOB: 1940-12-10   Cancelled Treatment:    Reason Eval/Treat Not Completed: Other (comment); pt transferred to [facility] this AM; will follow up for OT eval as able.  Lou Cal, OT [facility] Pager [phone] Office [place] 07/05/2018, 11:35 AM

## 2018-07-05 NOTE — Progress Notes (Signed)
Patient itching profusely from possibly scabies. Washed back to try to help with the itching. Paged MD on call. New order for benadryl PRN. Gave benadryl and will continue to monitor.

## 2018-07-05 NOTE — Progress Notes (Signed)
Repeat CT of head yesterday afternoon showed stability of left hemispheric subdural hematoma (initially assessed and managed at [facility] in late May and early June, 2020).  The CT appearance of the subdural is little changed over the past month.  Objective: Vital signs in last 24 hours: Vitals:   07/04/18 1642 07/04/18 2053 07/05/18 0125 07/05/18 0641  BP: (!) 154/75  128/80   Pulse: 76     Resp: 11     Temp: 97.9 F (36.6 C)  98.3 F (36.8 C) 98.4 F (36.9 C)  TempSrc: Axillary  Axillary Oral  SpO2: 96% 97%      Intake/Output from previous day: 06/29 0701 - 06/30 0700 In: -  Out: 1150 [Urine:1150] Intake/Output this shift: No intake/output data recorded.  CBC Recent Labs    07/03/18 1800 07/04/18 0224  WBC 11.4* 9.8  HGB 13.2 12.4*  HCT 41.6 38.4*  PLT 300 271   BMET Recent Labs    07/04/18 0224 07/04/18 0324  NA 137 139  K 3.8 3.5  CL 99 101  CO2 26 26  GLUCOSE 258* 226*  BUN 8 8  CREATININE 0.82 0.92  CALCIUM 8.7* 8.7*    Studies/Results: Ct Head Wo Contrast  Result Date: 07/04/2018 CLINICAL DATA:  Subdural hemorrhage follow-up EXAM: CT HEAD WITHOUT CONTRAST TECHNIQUE: Contiguous axial images were obtained from the base of the skull through the vertex without intravenous contrast. COMPARISON:  CT head 07/03/2018 FINDINGS: Brain: Left subdural hematoma slightly improved. No new area of hemorrhage since yesterday. No midline shift. Generalized atrophy with chronic white matter ischemia. Chronic ischemia in the thalamus bilaterally. No acute infarct or mass Vascular: Negative for hyperdense vessel. Atherosclerotic calcification cavernous carotid bilaterally. Skull: Negative Sinuses/Orbits: Mild mucosal edema paranasal sinuses. Negative orbit Other: None IMPRESSION: Left-sided subdural hematoma slightly improved from yesterday. No new hemorrhage or mass-effect compared with yesterday. Electronically Signed   By: Marlan Palauharles  Clark M.D.   On:  07/04/2018 15:24   Ct Head Wo Contrast  Result Date: 07/03/2018 CLINICAL DATA:  Multiple falls, recent stroke EXAM: CT HEAD WITHOUT CONTRAST CT CERVICAL SPINE WITHOUT CONTRAST TECHNIQUE: Multidetector CT imaging of the head and cervical spine was performed following the standard protocol without intravenous contrast. Multiplanar CT image reconstructions of the cervical spine were also generated. COMPARISON:  06/12/2018, 06/05/2018 FINDINGS: CT HEAD FINDINGS Brain: There is a redemonstrated left hemispheric subdural hematoma, which has slightly enlarged compared to prior examination dated 06/12/2018, with a new, high attenuation internal components. This now measures approximately 8 mm in maximum coronal thickness, previously 6 mm. There is no significant change in approximately 3 mm left right midline shift. Underlying small-vessel white matter disease and global volume loss. Vascular: No hyperdense vessel or unexpected calcification. Skull: Normal. Negative for fracture or focal lesion. Sinuses/Orbits: No acute finding. Other: Soft tissue contusion of the left frontal scalp. CT CERVICAL SPINE FINDINGS Alignment: Normal. Skull base and vertebrae: No acute fracture. No primary bone lesion or focal pathologic process. Soft tissues and spinal canal: No prevertebral fluid or swelling. No visible canal hematoma. 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 Upper chest: Negative. Other: None. IMPRESSION: 1. There is a redemonstrated left hemispheric subdural hematoma, which has slightly enlarged compared to prior examination dated 06/12/2018, with a new, high attenuation internal components. This now measures approximately 8 mm in maximum coronal thickness, previously 6 mm. There is no significant change in approximately 3 mm left right midline  shift. Underlying small-vessel white matter disease and global volume loss. 2.  Soft tissue contusion of the left  frontal scalp. 3. No fracture or static subluxation of the cervical spine. 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 Electronically Signed   By: Eddie Candle M.D.   On: 07/03/2018 18:30   Ct Cervical Spine Wo Contrast  Result Date: 07/03/2018 CLINICAL DATA:  Multiple falls, recent stroke EXAM: CT HEAD WITHOUT CONTRAST CT CERVICAL SPINE WITHOUT CONTRAST TECHNIQUE: Multidetector CT imaging of the head and cervical spine was performed following the standard protocol without intravenous contrast. Multiplanar CT image reconstructions of the cervical spine were also generated. COMPARISON:  06/12/2018, 06/05/2018 FINDINGS: CT HEAD FINDINGS Brain: There is a redemonstrated left hemispheric subdural hematoma, which has slightly enlarged compared to prior examination dated 06/12/2018, with a new, high attenuation internal components. This now measures approximately 8 mm in maximum coronal thickness, previously 6 mm. There is no significant change in approximately 3 mm left right midline shift. Underlying small-vessel white matter disease and global volume loss. Vascular: No hyperdense vessel or unexpected calcification. Skull: Normal. Negative for fracture or focal lesion. Sinuses/Orbits: No acute finding. Other: Soft tissue contusion of the left frontal scalp. CT CERVICAL SPINE FINDINGS Alignment: Normal. Skull base and vertebrae: No acute fracture. No primary bone lesion or focal pathologic process. Soft tissues and spinal canal: No prevertebral fluid or swelling. No visible canal hematoma. 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 Upper chest: Negative. Other: None. IMPRESSION: 1. There is a redemonstrated left hemispheric subdural hematoma, which has slightly enlarged compared to prior examination dated 06/12/2018, with a new, high attenuation internal components. This now measures approximately 8 mm in  maximum coronal thickness, previously 6 mm. There is no significant change in approximately 3 mm left right midline shift. Underlying small-vessel white matter disease and global volume loss. 2.  Soft tissue contusion of the left frontal scalp. 3. No fracture or static subluxation of the cervical spine. 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 Electronically Signed   By: Eddie Candle M.D.   On: 07/03/2018 18:30   Dg Chest Portable 1 View  Result Date: 07/03/2018 CLINICAL DATA:  Fall EXAM: PORTABLE CHEST 1 VIEW COMPARISON:  None. FINDINGS: Cardiomegaly. Lungs clear. No effusions or edema. No acute bony abnormality. IMPRESSION: Cardiomegaly.  No active disease. Electronically Signed   By: Rolm Baptise M.D.   On: 07/03/2018 19:30    Assessment/Plan: No role for neurosurgical intervention at this time.  Patient will need to follow-up regarding the SDH with his established follow-up from [facility] from earlier this month.   Hosie Spangle, MD 07/05/2018, 7:48 AM

## 2018-07-05 NOTE — Progress Notes (Signed)
Pt trying to get oob without assist as well as pulling tubes and lines. Renewed tele sitter order.

## 2018-07-05 NOTE — Progress Notes (Signed)
Called lab today to discuss the blood cultures again. They confirmed that only 1 out 2 sets were positive. Of that set, 1 bottle has CNS and the other has GPR. This is contamination. His PCT<0.1. D/w the result with Dr. Maryland Pink and we will dc zosyn.   Dc Zosyn  Onnie Boer, PharmD, BCIDP, AAHIVP, CPP Infectious Disease Pharmacist 07/05/2018 1:24 PM

## 2018-07-05 NOTE — Progress Notes (Signed)
Called patient's brother, Alyce Pagan, at [phone] to give an update. He shared a lot of his history. He appreciated the call & the care.

## 2018-07-05 NOTE — Progress Notes (Signed)
PT Cancellation Note  Patient Details Name: Mitchell Scott MRN: [id number] DOB: Aug 18, 1940   Cancelled Treatment:    Reason Eval/Treat Not Completed: Other (comment). Pt left for [facility]VC prior to PT eval. PT eval to be completed by PT staff at [facility].   Kittie Plater, PT, DPT Acute Rehabilitation Services Pager #: [phone] Office #: [phone]    Berline Lopes 07/05/2018, 10:03 AM

## 2018-07-05 NOTE — Evaluation (Signed)
Clinical/Bedside Swallow Evaluation Patient Details  Name: Mitchell Scott MRN: [id number] Date of Birth: 06/30/1940  Today's Date: 07/05/2018 Time: SLP Start Time (ACUTE ONLY): 1200 SLP Stop Time (ACUTE ONLY): 1212 SLP Time Calculation (min) (ACUTE ONLY): 12 min  Past Medical History:  Past Medical History:  Diagnosis Date  . Diabetes mellitus without complication (HCC)   . Heart attack (HCC)   . Hypercholesteremia   . Hypertension   . Liver damage   . SDH (subdural hematoma) (HCC)    Past Surgical History:  Past Surgical History:  Procedure Laterality Date  . TONSILLECTOMY     HPI:  Mitchell Scott is a 78 y.o. male with medical history significant of recent SDH (02/2018), hypertension, hyperlipidemia, type 2 diabetes mellitus, CAD, who presents with fall, head injury and AMS. Per report, pt fell twice prior to presentation to the ED. Not sure exactly what happened. Pt had head injury with left forehead hematoma.  He is confused. He is reportedly oriented x3 at baseline.  Moves all extremities, no facial droop was or slurred speech. Chest x-ray showed cardiomegaly without infiltration.  CT of C-spine is negative for bony fracture, but showed a degenerative disc disease. CT-head showed left SDH which is slightly enlarged compared to recent image 6/7, no significant change in approximately 3 mm left right midline shift.  Patient placed on stepdown bed for observation. Neurosurgeon, Dr. Newell CoralNudelman was consulted. COVID-19 positive.  Repeat CT done on 07/04/2018 stable with slight improvement of his SDH from the day before. Pt is consuming nectar thick liquids, no explanation found in chart.    Assessment / Plan / Recommendation Clinical Impression  Pt alert and participatory, attentive to tasks. Able to consume regular solids and thin liquids despite missing dentition. Reports he typically eats whatever he wants. Will advance diet to mech soft thin. No SLP f/u needed for swallowing. Will sign off.   SLP Visit Diagnosis: Dysphagia, unspecified (R13.10)    Aspiration Risk  Mild aspiration risk    Diet Recommendation Dysphagia 3 (Mech soft);Thin liquid   Liquid Administration via: Cup;Straw Medication Administration: Whole meds with liquid Supervision: Patient able to self feed Postural Changes: Seated upright at 90 degrees    Other  Recommendations     Follow up Recommendations None      Frequency and Duration            Prognosis        Swallow Study   General HPI: Mitchell Scott is a 78 y.o. male with medical history significant of recent SDH (02/2018), hypertension, hyperlipidemia, type 2 diabetes mellitus, CAD, who presents with fall, head injury and AMS. Per report, pt fell twice prior to presentation to the ED. Not sure exactly what happened. Pt had head injury with left forehead hematoma.  He is confused. He is reportedly oriented x3 at baseline.  Moves all extremities, no facial droop was or slurred speech. Chest x-ray showed cardiomegaly without infiltration.  CT of C-spine is negative for bony fracture, but showed a degenerative disc disease. CT-head showed left SDH which is slightly enlarged compared to recent image 6/7, no significant change in approximately 3 mm left right midline shift.  Patient placed on stepdown bed for observation. Neurosurgeon, Dr. Newell CoralNudelman was consulted. COVID-19 positive.  Repeat CT done on 07/04/2018 stable with slight improvement of his SDH from the day before. Pt is consuming nectar thick liquids, no explanation found in chart.  Type of Study: Bedside Swallow Evaluation Previous Swallow Assessment: none Diet Prior to  this Study: Dysphagia 2 (chopped);Nectar-thick liquids Temperature Spikes Noted: No Respiratory Status: Room air History of Recent Intubation: No Behavior/Cognition: Alert;Cooperative;Pleasant mood Oral Cavity Assessment: Within Functional Limits Oral Care Completed by SLP: No Oral Cavity - Dentition: Poor condition;Missing  dentition Vision: Functional for self-feeding Self-Feeding Abilities: Able to feed self Patient Positioning: Upright in bed Baseline Vocal Quality: Normal Volitional Cough: Strong Volitional Swallow: Able to elicit    Oral/Motor/Sensory Function Overall Oral Motor/Sensory Function: Within functional limits   Ice Chips Ice chips: Not tested   Thin Liquid Thin Liquid: Within functional limits Presentation: Straw;Self Fed    Nectar Thick Nectar Thick Liquid: Not tested   Honey Thick Honey Thick Liquid: Not tested   Puree Puree: Within functional limits   Solid     Solid: Within functional limits      Mitchell Scott, Mitchell Scott 07/05/2018,1:46 PM

## 2018-07-05 NOTE — Progress Notes (Signed)
Report given to [facility] RN

## 2018-07-05 NOTE — Progress Notes (Signed)
PHARMACY - PHYSICIAN COMMUNICATION CRITICAL VALUE ALERT - BLOOD CULTURE IDENTIFICATION (BCID)  Mitchell Scott is an 78 y.o. male who presented to [facility] on 07/03/2018 with a chief complaint of fall and AMS.  Assessment:  Now a second bottle of four blood cx is growing GPR (first bottle growing CNS); no rapid ID on GPR > will need to await full report.  Name of physician (or Provider) ContactedModesto Charon   Results for orders placed or performed during the hospital encounter of 07/03/18  Blood Culture ID Panel (Reflexed) (Collected: 07/04/2018  2:26 AM)  Result Value Ref Range   Enterococcus species NOT DETECTED NOT DETECTED   Listeria monocytogenes NOT DETECTED NOT DETECTED   Staphylococcus species DETECTED (A) NOT DETECTED   Staphylococcus aureus (BCID) NOT DETECTED NOT DETECTED   Methicillin resistance NOT DETECTED NOT DETECTED   Streptococcus species NOT DETECTED NOT DETECTED   Streptococcus agalactiae NOT DETECTED NOT DETECTED   Streptococcus pneumoniae NOT DETECTED NOT DETECTED   Streptococcus pyogenes NOT DETECTED NOT DETECTED   Acinetobacter baumannii NOT DETECTED NOT DETECTED   Enterobacteriaceae species NOT DETECTED NOT DETECTED   Enterobacter cloacae complex NOT DETECTED NOT DETECTED   Escherichia coli NOT DETECTED NOT DETECTED   Klebsiella oxytoca NOT DETECTED NOT DETECTED   Klebsiella pneumoniae NOT DETECTED NOT DETECTED   Proteus species NOT DETECTED NOT DETECTED   Serratia marcescens NOT DETECTED NOT DETECTED   Haemophilus influenzae NOT DETECTED NOT DETECTED   Neisseria meningitidis NOT DETECTED NOT DETECTED   Pseudomonas aeruginosa NOT DETECTED NOT DETECTED   Candida albicans NOT DETECTED NOT DETECTED   Candida glabrata NOT DETECTED NOT DETECTED   Candida krusei NOT DETECTED NOT DETECTED   Candida parapsilosis NOT DETECTED NOT DETECTED   Candida tropicalis NOT DETECTED NOT DETECTED    Wynona Neat, PharmD, BCPS  07/05/2018  12:02 AM

## 2018-07-05 NOTE — Evaluation (Signed)
Physical Therapy Evaluation Patient Details Name: Mitchell Scott MRN: [id number] DOB: July 27, 1940 Today's Date: 07/05/2018   History of Present Illness  78 y.o. 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  Pt found to be COVID (+) and transferred to [place].  Pt with significant PMH of HTN, MI, DM, developmental delay (cognitive and emotional).    Clinical Impression  Pt was able to sit and stand multiple times EOB and take a few small side step up at the [facility]  His cognition seems close to what his brother described over the phone, but he was ambulatory without an assistive device PTA (shfulling gait, and frequent falls at baseline).  Pt would benefit from acute and post acute rehab at discharge.   PT to follow acutely for deficits listed below.      Follow Up Recommendations SNF    Equipment Recommendations  Rolling walker with 5" wheels    Recommendations for Other Services   NA    Precautions / Restrictions Precautions Precautions: Fall Precaution Comments: h/o frequent falls      Mobility  Bed Mobility Overal bed mobility: Needs Assistance Bed Mobility: Supine to Sit;Sit to Supine     Supine to sit: Min assist;HOB elevated Sit to supine: Min assist   General bed mobility comments: Min assist mostly to support trunk to come to EOB.  Assist with one leg to return to supine.   Transfers Overall transfer level: Needs assistance Equipment used: None Transfers: Sit to/from Stand Sit to Stand: Min assist         General transfer comment: Heavy min assist x 3 to stand EOB.  Pt reporting dizziness and then wanted to sit down.    Ambulation/Gait Ambulation/Gait assistance: Mod assist Gait Distance (Feet): 3 Feet Assistive device: 1 person hand held assist Gait Pattern/deviations: Step-to pattern     General Gait Details: Side steps up to [facility] x2 with mod assist for balance, pt reporting dizziness and wanting to sit  down.          Balance Overall balance assessment: Needs assistance Sitting-balance support: Feet supported;No upper extremity supported Sitting balance-Leahy Scale: Fair Sitting balance - Comments: close supervision with bil UE prop EOB.  Postural control: Posterior lean Standing balance support: Bilateral upper extremity supported Standing balance-Leahy Scale: Poor Standing balance comment: needs heavy min to light mod assist in standing EOB.                              Pertinent Vitals/Pain Pain Assessment: No/denies pain    Home Living Family/patient expects to be discharged to:: Skilled nursing facility(brother says back to [facility] is the plan)                      Prior Function Level of Independence: Needs assistance   Gait / Transfers Assistance Needed: walks without AD, shuffles, falls frequently  ADL's / Homemaking Assistance Needed: Needs prompting to preform self care        Hand Dominance   Dominant Hand: Right    Extremity/Trunk Assessment   Upper Extremity Assessment Upper Extremity Assessment: Defer to OT evaluation    Lower Extremity Assessment Lower Extremity Assessment: Generalized weakness    Cervical / Trunk Assessment Cervical / Trunk Assessment: Normal  Communication   Communication: No difficulties  Cognition Arousal/Alertness: Awake/alert Behavior During Therapy: WFL for tasks assessed/performed Overall Cognitive Status:  History of cognitive impairments - at baseline                                 General Comments: Appears to be at baseline compared to what brother is reporting.       General Comments General comments (skin integrity, edema, etc.): VSS throughout on RA.  Attempted some tracking to try to assess for vestibular pathology, but pt had difficulty with commands to preform.  Also, looked for any nystagums with transitions supine to sit and back to supine, none noted.  No further testing  preformed.        Assessment/Plan    PT Assessment Patient needs continued PT services  PT Problem List Decreased strength;Decreased activity tolerance;Decreased balance;Decreased mobility;Decreased cognition;Decreased knowledge of use of DME;Decreased safety awareness;Decreased knowledge of precautions       PT Treatment Interventions DME instruction;Gait training;Functional mobility training;Therapeutic activities;Therapeutic exercise;Balance training;Neuromuscular re-education;Cognitive remediation;Patient/family education    PT Goals (Current goals can be found in the Care Plan section)  Acute Rehab PT Goals Patient Stated Goal: none stated, pt's brother wants him in a safe place where he can get therapy PT Goal Formulation: With family Time For Goal Achievement: 07/19/18 Potential to Achieve Goals: Good    Frequency Min 2X/week           AM-PAC PT "6 Clicks" Mobility  Outcome Measure Help needed turning from your back to your side while in a flat bed without using bedrails?: A Little Help needed moving from lying on your back to sitting on the side of a flat bed without using bedrails?: A Little Help needed moving to and from a bed to a chair (including a wheelchair)?: A Lot Help needed standing up from a chair using your arms (e.g., wheelchair or bedside chair)?: A Little Help needed to walk in hospital room?: A Lot Help needed climbing 3-5 steps with a railing? : Total 6 Click Score: 14    End of Session Equipment Utilized During Treatment: Gait belt Activity Tolerance: Patient tolerated treatment well Patient left: in bed;with call bell/phone within reach;with bed alarm set   PT Visit Diagnosis: Muscle weakness (generalized) (M62.81);Repeated falls (R29.6);Difficulty in walking, not elsewhere classified (R26.2);Other symptoms and signs involving the nervous system (Z61.096(R29.898)    Time: 1431-1450 PT Time Calculation (min) (ACUTE ONLY): 19 min   Charges:          Lurena Joinerebecca B. Starlett Pehrson, PT, DPT  Acute Rehabilitation [phone] pager [phone] office  @ [place]: [phone]   PT Evaluation $PT Eval Moderate Complexity: 1 Mod         07/05/2018, 5:51 PM

## 2018-07-05 NOTE — Progress Notes (Signed)
Spoke with pt brother, Berneta Sages, to inform pt has been transferred to [facility] and updated on pts progress.

## 2018-07-05 NOTE — Progress Notes (Signed)
Pt arrived to unit around 11am. Pt is alert and oriented to self. Pt has some confusion about where he is from but does recall family and events. Pt is impulsive. Placed floor mats due to fall risk. Also mits in place as pt pulls on tubes and lines. VSS. Oxygen 100 on RA. Foley in place. Bed in lowest position.

## 2018-07-05 NOTE — Progress Notes (Signed)
CSW acknowledges SNF consult pending PT/OT recs.   Marcellius Montagna, LCSW [phone]  

## 2018-07-06 ENCOUNTER — Inpatient Hospital Stay (HOSPITAL_COMMUNITY): Payer: Medicare Other

## 2018-07-06 DIAGNOSIS — U071 COVID-19: Secondary | ICD-10-CM

## 2018-07-06 LAB — COMPREHENSIVE METABOLIC PANEL
ALT: 26 U/L (ref 0–44)
AST: 22 U/L (ref 15–41)
Albumin: 4 g/dL (ref 3.5–5.0)
Alkaline Phosphatase: 105 U/L (ref 38–126)
Anion gap: 13 (ref 5–15)
BUN: 17 mg/dL (ref 8–23)
CO2: 27 mmol/L (ref 22–32)
Calcium: 9.5 mg/dL (ref 8.9–10.3)
Chloride: 101 mmol/L (ref 98–111)
Creatinine, Ser: 0.85 mg/dL (ref 0.61–1.24)
GFR calc Af Amer: >60 mL/min (ref >60)
GFR calc non Af Amer: >60 mL/min (ref >60)
Glucose, Bld: 152 mg/dL — ABNORMAL HIGH (ref 70–99)
Potassium: 3.5 mmol/L (ref 3.5–5.1)
Sodium: 141 mmol/L (ref 135–145)
Total Bilirubin: 1.1 mg/dL (ref 0.3–1.2)
Total Protein: 7.6 g/dL (ref 6.5–8.1)

## 2018-07-06 LAB — FERRITIN: Ferritin: 414 ng/mL — ABNORMAL HIGH (ref 24–336)

## 2018-07-06 LAB — D-DIMER, QUANTITATIVE: D-Dimer, Quant: 0.59 ug/mL-FEU — ABNORMAL HIGH (ref 0.00–0.50)

## 2018-07-06 LAB — CULTURE, BLOOD (ROUTINE X 2): Special Requests: ADEQUATE

## 2018-07-06 LAB — HEMOGLOBIN A1C
Hgb A1c MFr Bld: 8.6 % — ABNORMAL HIGH (ref 4.8–5.6)
Mean Plasma Glucose: 200.12 mg/dL

## 2018-07-06 LAB — GLUCOSE, CAPILLARY
Glucose-Capillary: 187 mg/dL — ABNORMAL HIGH (ref 70–99)
Glucose-Capillary: 401 mg/dL — ABNORMAL HIGH (ref 70–99)
Glucose-Capillary: 257 mg/dL — ABNORMAL HIGH (ref 70–99)
Glucose-Capillary: 87 mg/dL (ref 70–99)

## 2018-07-06 LAB — C-REACTIVE PROTEIN: CRP: 1.2 mg/dL — ABNORMAL HIGH (ref <1.0)

## 2018-07-06 MED ORDER — INSULIN DETEMIR 100 UNIT/ML ~~LOC~~ SOLN
10.0000 [IU] | Freq: Two times a day (BID) | SUBCUTANEOUS | Status: DC
  Administered 2018-07-06 – 2018-07-08 (×5): 10 [IU] via SUBCUTANEOUS
  Filled 2018-07-06 (×6): qty 0.1

## 2018-07-06 NOTE — Progress Notes (Addendum)
Back from CT. VSS. Pt is with in hallway with nurse Patient will not stay in bed or chair. Pt c/o blurry vison on rt eye. notified DR Aileen Fass. No new orders at this time.

## 2018-07-06 NOTE — Evaluation (Signed)
Occupational Therapy Evaluation Patient Details Name: Mitchell Scott MRN: [id number] DOB: 1940/06/25 Today's Date: 07/06/2018    History of Present Illness 78 y.o. 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  Pt found to be COVID (+) and transferred to [place].  Pt with significant PMH of HTN, MI, DM, developmental delay (cognitive and emotional).     Clinical Impression   PTA Pt at ALF - but has been at [facility] for 10 days PTA. Pt is largely able to complete ADL with supervision for prompting, and mobilizes without DME (frequent falls). TOday he presents as mod A for ADL max A for LB ADL, and mod A for transfers - very strong posterior lean initially. He seemed open to potentially trying DME for mobility but there was no RW in the room. CT after fall today (RN found him in room) showed that the SDH was stable - he complained about blurriness in his right eye but could not follow directions for visual assessments while sitting EOB. The RN staff also shared that he can feed himself with supervision. OT will continue to follow acutely and post-acute at SNF level to maximize safety and independence in ADL and functional transfers.  At the end of the session he was left with a word search (he loves puzzles) and listening to country music from the 1990's-2000. The RN called me to tell me that he was so happy and singing along with all the songs!    Follow Up Recommendations  SNF;Supervision/Assistance - 24 hour(return to [facility])    Equipment Recommendations  Other (comment)(defer to next venue of care)    Recommendations for Other Services       Precautions / Restrictions Precautions Precautions: Fall Precaution Comments: h/o frequent falls (including in the hospital) Restrictions Weight Bearing Restrictions: No      Mobility Bed Mobility Overal bed mobility: Needs Assistance Bed Mobility: Supine to Sit;Sit to Supine     Supine to  sit: HOB elevated;Supervision Sit to supine: Min assist(for BLE back into bed)   General bed mobility comments: Pt able also able to bridge in bed for bed pad adjustment  Transfers Overall transfer level: Needs assistance Equipment used: 1 person hand held assist Transfers: Sit to/from Stand Sit to Stand: Mod assist         General transfer comment: mod A x3 (2 times from bed, once from the recliner) for balance and boost into standing, heavy posterior lean initially, which improved with each transfer    Balance Overall balance assessment: Needs assistance Sitting-balance support: Feet supported;No upper extremity supported Sitting balance-Leahy Scale: Fair Sitting balance - Comments: close supervision with bil UE prop EOB.  Postural control: Posterior lean Standing balance support: Single extremity supported Standing balance-Leahy Scale: Poor Standing balance comment: mod A to correct posterior lean                           ADL either performed or assessed with clinical judgement   ADL Overall ADL's : Needs assistance/impaired Eating/Feeding: Supervision/ safety;Sitting Eating/Feeding Details (indicate cue type and reason): if someone does not sit with him while he eats he dumps all the food out in his bed. he is capable of performing hand to mouth and holding utensils Grooming: Moderate assistance;Sitting Grooming Details (indicate cue type and reason): Pt unable to maintain standing and requires encouragement to participate in grooming Upper Body Bathing: Min guard;Sitting  Lower Body Bathing: Min guard;Sitting/lateral leans   Upper Body Dressing : Minimal assistance   Lower Body Dressing: Moderate assistance   Toilet Transfer: Moderate assistance;Ambulation Toilet Transfer Details (indicate cue type and reason): simulated through recliner transfer, gait belt essential, heavy posterior lean - no RW in room to try Toileting- Clothing Manipulation and  Hygiene: Minimal assistance;Sitting/lateral lean       Functional mobility during ADLs: Moderate assistance;Cueing for safety(gait belt; hand held assist) General ADL Comments: not very motivated to perform ADL     Vision Patient Visual Report: Blurring of vision(R eye) Vision Assessment?: Vision impaired- to be further tested in functional context Additional Comments: attempted to perform visual assessment with Pt sitting EOB, Pt stated "II'm dizzy, and my eye is blurry" but unable to follow commands for visual assessment - OT will continue to monitor     Perception     Praxis      Pertinent Vitals/Pain Pain Assessment: No/denies pain     Hand Dominance Right   Extremity/Trunk Assessment Upper Extremity Assessment Upper Extremity Assessment: LUE deficits/detail LUE Deficits / Details: grasp is 5/5 but unable to sustain FF, LUE drops (he attributes this to an old shoulder injury) he also cannot lift arm straight up LUE Sensation: WNL LUE Coordination: decreased gross motor   Lower Extremity Assessment Lower Extremity Assessment: Generalized weakness   Cervical / Trunk Assessment Cervical / Trunk Assessment: Normal   Communication Communication Communication: No difficulties   Cognition Arousal/Alertness: Awake/alert Behavior During Therapy: WFL for tasks assessed/performed Overall Cognitive Status: History of cognitive impairments - at baseline                                 General Comments: Pt pleasant and cooperative throughout session   General Comments  no telemetry or O2 being monitored during session, Pt did not display any symptoms of SOB/DOE etc. Continues to report dizziness with positional changes    Exercises     Shoulder Instructions      Home Living Family/patient expects to be discharged to:: Skilled nursing facility([facility])                                        Prior  Functioning/Environment Level of Independence: Needs assistance  Gait / Transfers Assistance Needed: walks without AD, shuffles, falls frequently ADL's / Homemaking Assistance Needed: Needs prompting to preform self care            OT Problem List: Decreased activity tolerance;Impaired balance (sitting and/or standing);Decreased safety awareness;Decreased knowledge of use of DME or AE;Cardiopulmonary status limiting activity      OT Treatment/Interventions: Self-care/ADL training;DME and/or AE instruction;Therapeutic activities;Patient/family education;Balance training    OT Goals(Current goals can be found in the care plan section) Acute Rehab OT Goals Patient Stated Goal: none stated, pt's brother wants him in a safe place where he can get therapy OT Goal Formulation: Patient unable to participate in goal setting Time For Goal Achievement: 07/20/18 Potential to Achieve Goals: Fair ADL Goals Pt Will Perform Grooming: with set-up;sitting Pt Will Perform Upper Body Dressing: with set-up;sitting Pt Will Perform Lower Body Dressing: with set-up;sit to/from stand Pt Will Transfer to Toilet: with min assist;ambulating Pt Will Perform Toileting - Clothing Manipulation and hygiene: with min assist;sit to/from stand  OT Frequency: Min  2X/week   Barriers to D/C:            Co-evaluation              AM-PAC OT "6 Clicks" Daily Activity     Outcome Measure Help from another person eating meals?: A Little Help from another person taking care of personal grooming?: A Little Help from another person toileting, which includes using toliet, bedpan, or urinal?: A Lot Help from another person bathing (including washing, rinsing, drying)?: A Lot Help from another person to put on and taking off regular upper body clothing?: A Little Help from another person to put on and taking off regular lower body clothing?: A Lot 6 Click Score: 15   End of Session Equipment Utilized During  Treatment: Gait belt Nurse Communication: Mobility status  Activity Tolerance: Other (comment)(Pt limited by dizziness) Patient left: in bed;with call bell/phone within reach;with bed alarm set;Other (comment)(housekeeping in the room, Pt doing word search)  OT Visit Diagnosis: Unsteadiness on feet (R26.81);Other abnormalities of gait and mobility (R26.89);Repeated falls (R29.6);Muscle weakness (generalized) (M62.81);History of falling (Z91.81);Other symptoms and signs involving the nervous system (R29.898);Dizziness and giddiness (R42)                Time: [id number] OT Time Calculation (min): 23 min Charges:  OT General Charges $OT Visit: 1 Visit OT Evaluation $OT Eval Moderate Complexity: 1 Mod OT Treatments $Self Care/Home Management : 8-22 mins  Sherryl MangesLaura Yona Kosek OTR/L Acute Rehabilitation Services Pager: [phone] Office: [phone]  Evern BioLaura J Lowana Hable 07/06/2018, 5:26 PM

## 2018-07-06 NOTE — Progress Notes (Signed)
OT Cancellation Note  Patient Details Name: Mitchell Scott MRN: [id number] DOB: 1940/05/28   Cancelled Treatment:    Reason Eval/Treat Not Completed: Patient at procedure or test/ unavailable(CT)., OT will continue to follow for eval as schedule allows.  Merri Ray Deven Furia 07/06/2018, 1:46 PM   Hulda Humphrey OTR/L Acute Rehabilitation Services Pager: [phone] Office: [phone]

## 2018-07-06 NOTE — Progress Notes (Addendum)
1220 Rn was in another patient room administering medications when heard Nurse scream for help. Pt was found on floor in front of bathroom. Pt was in chair with chair alarm on and tele sitter monitoring. Pt was assessed and 3 other nurses helped to assist patient to bed. Pt c/o pain in left ribs Headache. Environmental health practitioner. Notified MD. See new orders. Notified brother of fall.   1322 Pt continues  to get OOB, Pt in wheelchair and is accompany the nurse at this time. No sitters available. Pt is not appropriate for tele sitter. Change nurse made aware again that patient is not appropriate for tele sitter needs personal sitter.

## 2018-07-06 NOTE — Progress Notes (Signed)
[facility] PROGRESS NOTE    Progress Note  Mitchell Scott  ZOX:[id number] DOB: 10/31/1940 DOA: 07/03/2018 PCP: System, Pcp Not In     Brief Narrative:   Mitchell Hummingbirddwin Riggan is an 78 y.o. male past medical history significant for subdural hematoma on 02/24/2018, essential hypertension, diabetes mellitus type 2, CAD who presents with a fall, head injury and altered mental status.  Per report patient fell twice prior to presentation not sure of circumstances.  CT of the C-spine is negative for any bony fractures, chest x-ray showed cardiomegaly without an filtrates, CT of the head showed left subdural hematoma which is slightly enlarged compare to recent imaging done on 06/12/2018. new, high attenuation internal components. This now measures approximately 8 mm in maximum coronal thickness, previously 6 mm.  Neurosurgery was consulted they recommended to repeat a CT on 07/04/2018 which shows slight improvement of his subdural hematoma compared to the day before no new hemorrhage or mass-effect.  Surgery recommended follow-up with them as an outpatient.  Assessment/Plan:   SDH (subdural hematoma) (HCC): With a history of subdural hematoma managed at [facility] on may 2020. Has had 2 recurrent falls, CT on admission showed a large hematoma, neurosurgery was consulted recommended to repeat a CT scan that showed improvement in abscess of the dural hematoma. Neurosurgery was consulted recommended no surgical intervention at this time and follow-up with them as an outpatient, they also recommended no Lovenox, the patient was placed on SCDs with frequent neuro checks.  1 out of 2 blood cultures positive for staph coag negative: Likely a contaminant will discontinue IV antibiotics Remained afebrile urine cultures show no growth.  Acute metabolic encephalopathy: Likely due to subdural hematoma versus infectious etiology cont conservative treatment see condition for further details  COVID-19  viral infection: Saturating greater than 95% on room air. Her SARS-CoV-2 test was positive on 06/25/2018. Her inflammatory markers are slowly trending up. She started empirically on IV Solu-Medrol 07/04/2018, along with vitamin C and zinc. Her procalcitonin was less than 0.10 Discontinue IV empiric antibiotics.  Diabetes mellitus type 2: She is currently on sliding scale insulin, she has also been started on IV Solu-Medrol which will make her blood glucose significantly high. We will start her on low-dose long-acting insulin, check an A1c.  Essential hypertension Lisinopril was stopped on admission, she is currently on Norvasc 5. Pressure seems to be fairly controlled.  Multiple falls: Physical therapy has been consulted. Patient fell today, will get a CT scan of the head.  DVT prophylaxis: lovenxo Family Communication:none Disposition Plan/Barrier to D/C: once off oxygen Code Status:     Code Status Orders  (From admission, onward)         Start     Ordered   07/04/18 0136  Do not attempt resuscitation (DNR)  Continuous    Question Answer Comment  In the event of cardiac or respiratory ARREST Do not call a "code blue"   In the event of cardiac or respiratory ARREST Do not perform Intubation, CPR, defibrillation or ACLS   In the event of cardiac or respiratory ARREST Use medication by any route, position, wound care, and other measures to relive pain and suffering. May use oxygen, suction and manual treatment of airway obstruction as needed for comfort.      07/04/18 0135        Code Status History    This patient has a current code status but no historical code status.   Advance Care Planning Activity  Advance Directive Documentation     Most Recent Value  Type of Advance Directive  Out of facility DNR (pink MOST or yellow form)  Pre-existing out of facility DNR order (yellow form or pink MOST form)  -  "MOST" Form in Place?  -        IV Access:     Peripheral IV   Procedures and diagnostic studies:   Ct Head Wo Contrast  Result Date: 07/04/2018 CLINICAL DATA:  Subdural hemorrhage follow-up EXAM: CT HEAD WITHOUT CONTRAST TECHNIQUE: Contiguous axial images were obtained from the base of the skull through the vertex without intravenous contrast. COMPARISON:  CT head 07/03/2018 FINDINGS: Brain: Left subdural hematoma slightly improved. No new area of hemorrhage since yesterday. No midline shift. Generalized atrophy with chronic white matter ischemia. Chronic ischemia in the thalamus bilaterally. No acute infarct or mass Vascular: Negative for hyperdense vessel. Atherosclerotic calcification cavernous carotid bilaterally. Skull: Negative Sinuses/Orbits: Mild mucosal edema paranasal sinuses. Negative orbit Other: None IMPRESSION: Left-sided subdural hematoma slightly improved from yesterday. No new hemorrhage or mass-effect compared with yesterday. Electronically Signed   By: Marlan Palauharles  Clark M.D.   On: 07/04/2018 15:24     Medical Consultants:    None.  Anti-Infectives:   None  Subjective:    Mitchell Scott pleasantly confused  Objective:    Vitals:   07/06/18 0400 07/06/18 0433 07/06/18 0435 07/06/18 0800  BP:  (!) 154/67  132/72  Pulse: 86 79    Resp: 20 13    Temp:   98 F (36.7 C) 97.8 F (36.6 C)  TempSrc:   Oral Oral  SpO2: 99% 97%      Intake/Output Summary (Last 24 hours) at 07/06/2018 0847 Last data filed at 07/06/2018 0440 Gross per 24 hour  Intake 440 ml  Output 850 ml  Net -410 ml   There were no vitals filed for this visit.  Exam: General exam: In no acute distress. Respiratory system: Good air movement and clear to auscultation. Cardiovascular system: S1 & S2 heard, RRR. No JVD, murmurs. Gastrointestinal system: Abdomen is nondistended, soft and nontender.  Central nervous system: Alert and oriented. No focal neurological deficits. Extremities: No pedal edema. Skin: No rashes, lesions or ulcers  Data  Reviewed:    Labs: Basic Metabolic Panel: Recent Labs  Lab 07/03/18 1800 07/04/18 0224 07/04/18 0324 07/06/18 0258  NA 136 137 139 141  K 3.6 3.8 3.5 3.5  CL 98 99 101 101  CO2 26 26 26 27   GLUCOSE 337* 258* 226* 152*  BUN 10 8 8 17   CREATININE 1.04 0.82 0.92 0.85  CALCIUM 9.0 8.7* 8.7* 9.5   GFR CrCl cannot be calculated (Unknown ideal weight.). Liver Function Tests: Recent Labs  Lab 07/03/18 1800 07/04/18 0324 07/06/18 0258  AST 26 22 22   ALT 26 22 26   ALKPHOS 111 93 105  BILITOT 0.8 1.0 1.1  PROT 7.0 6.3* 7.6  ALBUMIN 3.7 3.3* 4.0   No results for input(s): LIPASE, AMYLASE in the last 168 hours. No results for input(s): AMMONIA in the last 168 hours. Coagulation profile Recent Labs  Lab 07/04/18 0224  INR 1.2   COVID-19 Labs  Recent Labs    07/04/18 0224 07/04/18 0324 07/06/18 0258  DDIMER 1.56* 1.43* 0.59*  FERRITIN 380* 345* 414*  LDH 237*  --   --   CRP <0.8 <0.8 1.2*    Lab Results  Component Value Date   SARSCOV2NAA POSITIVE (A) 07/03/2018    CBC: Recent Labs  Lab 07/03/18 1800 07/04/18 0224 07/05/18 1145  WBC 11.4* 9.8 9.6  NEUTROABS 8.1*  --  8.7*  HGB 13.2 12.4* 13.8  HCT 41.6 38.4* 43.1  MCV 85.2 85.3 86.5  PLT 300 271 321   Cardiac Enzymes: Recent Labs  Lab 07/04/18 0324  CKTOTAL 120   BNP (last 3 results) No results for input(s): PROBNP in the last 8760 hours. CBG: Recent Labs  Lab 07/04/18 2132 07/05/18 0906 07/05/18 1129 07/05/18 1610 07/05/18 2128  GLUCAP 175* 205* 182* 410* 191*   D-Dimer: Recent Labs    07/04/18 0324 07/06/18 0258  DDIMER 1.43* 0.59*   Hgb A1c: No results for input(s): HGBA1C in the last 72 hours. Lipid Profile: Recent Labs    07/04/18 0230 07/04/18 0326  TRIG 78 74   Thyroid function studies: No results for input(s): TSH, T4TOTAL, T3FREE, THYROIDAB in the last 72 hours.  Invalid input(s): FREET3 Anemia work up: Recent Labs    07/04/18 0324 07/06/18 0258  FERRITIN  345* 414*   Sepsis Labs: Recent Labs  Lab 07/03/18 1800 07/04/18 0224 07/05/18 1145  PROCALCITON  --  <0.10  --   WBC 11.4* 9.8 9.6   Microbiology Recent Results (from the past 240 hour(s))  Urine culture     Status: None   Collection Time: 07/03/18  7:14 PM   Specimen: Urine, Random  Result Value Ref Range Status   Specimen Description URINE, RANDOM  Final   Special Requests NONE  Final   Culture   Final    NO GROWTH Performed at [facility], [address], [place], [place] [address]    Report Status 07/04/2018 FINAL  Final  SARS Coronavirus 2 (CEPHEID - Performed in [place] hospital lab), Hosp Order     Status: Abnormal   Collection Time: 07/03/18  7:50 PM   Specimen: Nasopharyngeal Swab  Result Value Ref Range Status   SARS Coronavirus 2 POSITIVE (A) NEGATIVE Final    Comment: RESULT CALLED TO, READ BACK BY AND VERIFIED WITH: RN P JACSON @ 2140 07/03/18 BY S GEZAHEGN  (NOTE) If result is NEGATIVE SARS-CoV-2 target nucleic acids are NOT DETECTED. The SARS-CoV-2 RNA is generally detectable in upper and lower  respiratory specimens during the acute phase of infection. The lowest  concentration of SARS-CoV-2 viral copies this assay can detect is 250  copies / mL. A negative result does not preclude SARS-CoV-2 infection  and should not be used as the sole basis for treatment or other  patient management decisions.  A negative result may occur with  improper specimen collection / handling, submission of specimen other  than nasopharyngeal swab, presence of viral mutation(s) within the  areas targeted by this assay, and inadequate number of viral copies  (<250 copies / mL). A negative result must be combined with clinical  observations, patient history, and epidemiological information. If result is POSITIVE SARS-CoV-2 target nucleic acids are DETECT ED. The SARS-CoV-2 RNA is generally detectable in upper and lower  respiratory specimens during the acute phase  of infection.  Positive  results are indicative of active infection with SARS-CoV-2.  Clinical  correlation with patient history and other diagnostic information is  necessary to determine patient infection status.  Positive results do  not rule out bacterial infection or co-infection with other viruses. If result is PRESUMPTIVE POSTIVE SARS-CoV-2 nucleic acids MAY BE PRESENT.   A presumptive positive result was obtained on the submitted specimen  and confirmed on repeat testing.  While  2019 novel coronavirus  (SARS-CoV-2) nucleic acids may be present in the submitted sample  additional confirmatory testing may be necessary for epidemiological  and / or clinical management purposes  to differentiate between  SARS-CoV-2 and other Sarbecovirus currently known to infect humans.  If clinically indicated additional testing with an alternate test  methodology (LAB74 53) is advised. The SARS-CoV-2 RNA is generally  detectable in upper and lower respiratory specimens during the acute  phase of infection. The expected result is Negative. Fact Sheet for Patients:  BoilerBrush.com.cyhttps://www.fda.gov/media/136312/download Fact Sheet for Healthcare Providers: https://pope.com/https://www.fda.gov/media/136313/download This test is not yet approved or cleared by the [place] FDA and has been authorized for detection and/or diagnosis of SARS-CoV-2 by FDA under an Emergency Use Authorization (EUA).  This EUA will remain in effect (meaning this test can be used) for the duration of the COVID-19 declaration under Section 564(b)(1) of the Act, 21 U.S.C. section 360bbb-3(b)(1), unless the authorization is terminated or revoked sooner. Performed at [facility], [address] [place], [place] [address]   Culture, blood (Routine X 2) w Reflex to ID Panel     Status: Abnormal (Preliminary result)   Collection Time: 07/04/18  2:26 AM   Specimen: BLOOD  Result Value Ref Range Status   Specimen Description BLOOD LEFT  ANTECUBITAL  Final   Special Requests   Final    BOTTLES DRAWN AEROBIC AND ANAEROBIC Blood Culture adequate volume   Culture  Setup Time   Final    ANAEROBIC BOTTLE ONLY GRAM POSITIVE COCCI CRITICAL RESULT CALLED TO, READ BACK BY AND VERIFIED WITH: J MILLEN PHARMD 07/04/18 2107 JDW AEROBIC BOTTLE ONLY GRAM POSITIVE RODS CRITICAL RESULT CALLED TO, READ BACK BY AND VERIFIED WITH: V BRKY PHARMD 07/04/18 2341 JDW    Culture (A)  Final    STAPHYLOCOCCUS SPECIES (COAGULASE NEGATIVE) THE SIGNIFICANCE OF ISOLATING THIS ORGANISM FROM A SINGLE SET OF BLOOD CULTURES WHEN MULTIPLE SETS ARE DRAWN IS UNCERTAIN. PLEASE NOTIFY THE MICROBIOLOGY DEPARTMENT WITHIN ONE WEEK IF SPECIATION AND SENSITIVITIES ARE REQUIRED. CULTURE REINCUBATED FOR BETTER GROWTH Performed at [facility], [address] [place], [place] [address]    Report Status PENDING  Incomplete  Blood Culture ID Panel (Reflexed)     Status: Abnormal   Collection Time: 07/04/18  2:26 AM  Result Value Ref Range Status   Enterococcus species NOT DETECTED NOT DETECTED Final   Listeria monocytogenes NOT DETECTED NOT DETECTED Final   Staphylococcus species DETECTED (A) NOT DETECTED Final    Comment: Methicillin (oxacillin) susceptible coagulase negative staphylococcus. Possible blood culture contaminant (unless isolated from more than one blood culture draw or clinical case suggests pathogenicity). No antibiotic treatment is indicated for blood  culture contaminants. CRITICAL RESULT CALLED TO, READ BACK BY AND VERIFIED WITH: Ihor AustinJ MILLEN [facility] 07/04/18 2107 JDW    Staphylococcus aureus (BCID) NOT DETECTED NOT DETECTED Final   Methicillin resistance NOT DETECTED NOT DETECTED Final   Streptococcus species NOT DETECTED NOT DETECTED Final   Streptococcus agalactiae NOT DETECTED NOT DETECTED Final   Streptococcus pneumoniae NOT DETECTED NOT DETECTED Final   Streptococcus pyogenes NOT DETECTED NOT DETECTED Final   Acinetobacter baumannii NOT  DETECTED NOT DETECTED Final   Enterobacteriaceae species NOT DETECTED NOT DETECTED Final   Enterobacter cloacae complex NOT DETECTED NOT DETECTED Final   Escherichia coli NOT DETECTED NOT DETECTED Final   Klebsiella oxytoca NOT DETECTED NOT DETECTED Final   Klebsiella pneumoniae NOT DETECTED NOT DETECTED Final   Proteus species NOT  DETECTED NOT DETECTED Final   Serratia marcescens NOT DETECTED NOT DETECTED Final   Haemophilus influenzae NOT DETECTED NOT DETECTED Final   Neisseria meningitidis NOT DETECTED NOT DETECTED Final   Pseudomonas aeruginosa NOT DETECTED NOT DETECTED Final   Candida albicans NOT DETECTED NOT DETECTED Final   Candida glabrata NOT DETECTED NOT DETECTED Final   Candida krusei NOT DETECTED NOT DETECTED Final   Candida parapsilosis NOT DETECTED NOT DETECTED Final   Candida tropicalis NOT DETECTED NOT DETECTED Final    Comment: Performed at [facility], [address], [place], [place] [address]  Culture, blood (Routine X 2) w Reflex to ID Panel     Status: None (Preliminary result)   Collection Time: 07/04/18  2:29 AM   Specimen: BLOOD RIGHT HAND  Result Value Ref Range Status   Specimen Description BLOOD RIGHT HAND  Final   Special Requests   Final    BOTTLES DRAWN AEROBIC AND ANAEROBIC Blood Culture results may not be optimal due to an inadequate volume of blood received in culture bottles   Culture   Final    NO GROWTH 2 DAYS Performed at [facility], [address], [place], [place] [address]    Report Status PENDING  Incomplete  MRSA PCR Screening     Status: Abnormal   Collection Time: 07/05/18 11:53 AM  Result Value Ref Range Status   MRSA by PCR POSITIVE (A) NEGATIVE Final    Comment:        The GeneXpert MRSA Assay (FDA approved for NASAL specimens only), is one component of a comprehensive MRSA colonization surveillance program. It is not intended to diagnose MRSA infection nor to guide or monitor treatment for MRSA infections.  RESULT CALLED TO, READ BACK BY AND VERIFIED WITH: H.[facility] AT 1852 ON 07/05/18 BY N.THOMPSON Performed at [facility], [address], [place], [place] [address]   Culture, blood (routine x 2)     Status: None (Preliminary result)   Collection Time: 07/06/18  3:05 AM   Specimen: BLOOD  Result Value Ref Range Status   Specimen Description   Final    BLOOD RIGHT ANTECUBITAL Performed at [facility], [address] [place], [place] [address]    Special Requests   Final    BOTTLES DRAWN AEROBIC ONLY Blood Culture adequate volume Performed at [facility], [address] [place], [place] [address]    Culture PENDING  Incomplete   Report Status PENDING  Incomplete     Medications:   . amLODipine  5 mg Oral Daily  . atorvastatin  40 mg Oral q1800  . cholecalciferol  1,000 Units Oral Daily  . insulin aspart  0-20 Units Subcutaneous TID WC  . insulin aspart  0-5 Units Subcutaneous QHS  . methylPREDNISolone (SOLU-MEDROL) injection  40 mg Intravenous Daily  . vitamin C  1,000 mg Oral Daily  . zinc sulfate  220 mg Oral Daily   Continuous Infusions:    LOS: 2 days   Marinda Elk  [facility]  07/06/2018, 8:47 AM

## 2018-07-06 NOTE — Progress Notes (Signed)
   07/06/18 1200  What Happened  Was fall witnessed? No (fall at 1220)  Was patient injured? Unsure  Patient found on floor  Found by Staff-comment  Follow Up  MD notified Dr. Aileen Fass  Time MD notified 68  Family notified Yes-comment  Time family notified 1256  Additional tests Yes-comment (CT head/ chest)  Adult Fall Risk Assessment  Risk Factor Category (scoring not indicated) Fall has occurred during this admission (document High fall risk)  Patient Fall Risk Level High fall risk  Adult Fall Risk Interventions  Required Bundle Interventions *See Row Information* High fall risk - low, moderate, and high requirements implemented  Additional Interventions Camera surveillance (with patient/family notification & education)  Screening for Fall Injury Risk (To be completed on HIGH fall risk patients) - Assessing Need for Low Bed  Risk For Fall Injury- Low Bed Criteria Previous fall this admission (no low beds GVC)  Will Implement Low Bed and Floor Mats Yes  Video Monitoring (Completed by VMT Department Only)  # of Interactions/hr No interaction needed

## 2018-07-06 NOTE — Progress Notes (Addendum)
Pt continues to get out of bed despite education and redirection from nurse and tete sitter. Patient unstable on feet. Nurse has to go in room every 15 min because patient is getting  out of bed.  1800 1:1 sitter order

## 2018-07-07 LAB — COMPREHENSIVE METABOLIC PANEL
ALT: 21 U/L (ref 0–44)
AST: 19 U/L (ref 15–41)
Albumin: 3.2 g/dL — ABNORMAL LOW (ref 3.5–5.0)
Alkaline Phosphatase: 88 U/L (ref 38–126)
Anion gap: 10 (ref 5–15)
BUN: 18 mg/dL (ref 8–23)
CO2: 25 mmol/L (ref 22–32)
Calcium: 8.6 mg/dL — ABNORMAL LOW (ref 8.9–10.3)
Chloride: 104 mmol/L (ref 98–111)
Creatinine, Ser: 0.73 mg/dL (ref 0.61–1.24)
GFR calc Af Amer: >60 mL/min (ref >60)
GFR calc non Af Amer: >60 mL/min (ref >60)
Glucose, Bld: 196 mg/dL — ABNORMAL HIGH (ref 70–99)
Potassium: 3.5 mmol/L (ref 3.5–5.1)
Sodium: 139 mmol/L (ref 135–145)
Total Bilirubin: 0.5 mg/dL (ref 0.3–1.2)
Total Protein: 5.9 g/dL — ABNORMAL LOW (ref 6.5–8.1)

## 2018-07-07 LAB — GLUCOSE, CAPILLARY
Glucose-Capillary: 203 mg/dL — ABNORMAL HIGH (ref 70–99)
Glucose-Capillary: 244 mg/dL — ABNORMAL HIGH (ref 70–99)
Glucose-Capillary: 453 mg/dL — ABNORMAL HIGH (ref 70–99)
Glucose-Capillary: 383 mg/dL — ABNORMAL HIGH (ref 70–99)

## 2018-07-07 LAB — C-REACTIVE PROTEIN: CRP: <0.8 mg/dL (ref <1.0)

## 2018-07-07 LAB — FERRITIN: Ferritin: 256 ng/mL (ref 24–336)

## 2018-07-07 LAB — D-DIMER, QUANTITATIVE: D-Dimer, Quant: 0.69 ug/mL-FEU — ABNORMAL HIGH (ref 0.00–0.50)

## 2018-07-07 MED ORDER — TAMSULOSIN HCL 0.4 MG PO CAPS
0.4000 mg | ORAL_CAPSULE | Freq: Every day | ORAL | Status: DC
  Administered 2018-07-08: 0.4 mg via ORAL
  Filled 2018-07-07 (×2): qty 1

## 2018-07-07 NOTE — Progress Notes (Signed)
Inpatient Diabetes Program Recommendations  AACE/ADA: New Consensus Statement on Inpatient Glycemic Control (2015)  Target Ranges:  Prepandial:   less than 140 mg/dL      Peak postprandial:   less than 180 mg/dL (1-2 hours)      Critically ill patients:  140 - 180 mg/dL   Lab Results  Component Value Date   GLUCAP 244 (H) 07/07/2018   HGBA1C 8.6 (H) 07/06/2018    Review of Glycemic Control Results for TUNIS, GENTLE (MRN [id number]) as of 07/07/2018 15:00  Ref. Range 07/06/2018 13:18 07/06/2018 17:17 07/06/2018 21:19 07/07/2018 07:31 07/07/2018 11:16  Glucose-Capillary Latest Ref Range: 70 - 99 mg/dL 401 (H) 257 (H) 87 203 (H) 244 (H)   Diabetes history: DM 2 Outpatient Diabetes medications:  Levemir 6 units q HS, Metformin 1000 mg daily with breakfast Current orders for Inpatient glycemic control:  Novolog resistant tid with meals and HS Levemir 10 units bid Inpatient Diabetes Program Recommendations:   May consider adding Novolog meal coverage 4 units tid with meals (hold if patient eats less 50%).   Thanks,  Adah Perl, RN, BC-ADM Inpatient Diabetes Coordinator Pager [phone] (8a-5p)

## 2018-07-07 NOTE — Progress Notes (Signed)
Last night, called patient's brother to give an update. He did not answer the phone. Left message for him to call me back.

## 2018-07-07 NOTE — Progress Notes (Signed)
Bladder scan 586 ml c/o feeling like he has to void but can't. Notified Dr Aileen Fass. See new orders

## 2018-07-07 NOTE — Progress Notes (Addendum)
[facility] PROGRESS NOTE    Progress Note  Mitchell Hummingbirddwin Sonnenberg  ZOX:[id number] DOB: 01/22/1940 DOA: 07/03/2018 PCP: System, Pcp Not In     Brief Narrative:   Mitchell Scott is an 78 y.o. male past medical history significant for subdural hematoma on 02/24/2018, essential hypertension, diabetes mellitus type 2, CAD who presents with a fall, head injury and altered mental status.  Per report patient fell twice prior to presentation not sure of circumstances.  CT of the C-spine is negative for any bony fractures, chest x-ray showed cardiomegaly without an filtrates, CT of the head showed left subdural hematoma which is slightly enlarged compare to recent imaging done on 06/12/2018. new, high attenuation internal components. This now measures approximately 8 mm in maximum coronal thickness, previously 6 mm.  Neurosurgery was consulted they recommended to repeat a CT on 07/04/2018 which shows slight improvement of his subdural hematoma compared to the day before no new hemorrhage or mass-effect.  Surgery recommended follow-up with them as an outpatient.  Assessment/Plan:   SDH (subdural hematoma) (HCC): With a history of subdural hematoma managed at [facility] on may 2020. Has had 2 recurrent falls, CT on admission showed a large hematoma, neurosurgery was consulted recommended to repeat a CT scan that showed improvement in sub-dural hematoma. Neurosurgery was consulted recommended no surgical intervention at this time and follow-up with them as an outpatient, they also recommended no Lovenox, the patient was placed on SCDs with frequent neuro checks.  1 out of 2 blood cultures positive for staph coag negative: Likely a contaminant will discontinue IV antibiotics Remained afebrile urine cultures show no growth.  Acute metabolic encephalopathy: Likely due to subdural hematoma versus infectious etiology cont conservative treatment see condition for further details  COVID-19 viral  infection: Saturating greater than 95% on room air. Her SARS-CoV-2 test was positive on 06/25/2018. Her inflammatory markers are down, no oxygen requirements. She started empirically on IV Solu-Medrol 07/04/2018, along with vitamin C and zinc. Her procalcitonin was less than 0.10 Discontinue IV empiric antibiotics. tapered down IV steroids  Diabetes mellitus type 2: Discontinue IV steroids today, continue long-acting insulin plus sliding scale. His A1c was 8.6  Essential hypertension Lisinopril was stopped on admission, she is currently on Norvasc 5. Pressure seems to be fairly controlled.  Multiple falls: Physical therapy has been consulted. Patient fell today, CT of the head shows no acute findings, he is mentating well this morning. Imaging of the x-ray show no fractures of the ribs.  DVT prophylaxis: lovenxo Family Communication:none Disposition Plan/Barrier to D/C: once off oxygen Code Status:     Code Status Orders  (From admission, onward)         Start     Ordered   07/04/18 0136  Do not attempt resuscitation (DNR)  Continuous    Question Answer Comment  In the event of cardiac or respiratory ARREST Do not call a "code blue"   In the event of cardiac or respiratory ARREST Do not perform Intubation, CPR, defibrillation or ACLS   In the event of cardiac or respiratory ARREST Use medication by any route, position, wound care, and other measures to relive pain and suffering. May use oxygen, suction and manual treatment of airway obstruction as needed for comfort.      07/04/18 0135        Code Status History    This patient has a current code status but no historical code status.   Advance Care Planning Activity    Advance  Directive Documentation     Most Recent Value  Type of Advance Directive  Out of facility DNR (pink MOST or yellow form)  Pre-existing out of facility DNR order (yellow form or pink MOST form)  -  "MOST" Form in Place?  -        IV  Access:    Peripheral IV   Procedures and diagnostic studies:   Ct Head Wo Contrast  Result Date: 07/06/2018 CLINICAL DATA:  Follow-up subdural hematoma EXAM: CT HEAD WITHOUT CONTRAST TECHNIQUE: Contiguous axial images were obtained from the base of the skull through the vertex without intravenous contrast. COMPARISON:  07/04/2018 FINDINGS: Brain: Left-sided subdural hematoma is again identified and unchanged in appearance. No significant increase in thickness or degree of hemorrhage is noted. Mild atrophic changes are again identified. Chronic white matter ischemic change is noted as well. No new focal hemorrhage is noted. Vascular: No hyperdense vessel or unexpected calcification. Skull: Normal. Negative for fracture or focal lesion. Sinuses/Orbits: No acute finding. Other: None. IMPRESSION: Stable left-sided subdural hematoma. No new significant hemorrhage is noted. Electronically Signed   By: Alcide CleverMark  Lukens M.D.   On: 07/06/2018 14:09   Ct Chest Wo Contrast  Result Date: 07/06/2018 CLINICAL DATA:  Falls EXAM: CT CHEST WITHOUT CONTRAST TECHNIQUE: Multidetector CT imaging of the chest was performed following the standard protocol without IV contrast. COMPARISON:  06/12/2018 FINDINGS: Evaluation is generally limited by breath motion artifact throughout. Cardiovascular: Aortic atherosclerosis. Three-vessel coronary artery calcifications. Normal heart size. No pericardial effusion. Mediastinum/Nodes: No enlarged mediastinal, hilar, or axillary lymph nodes. Large hiatal hernia. Thyroid gland, trachea, and esophagus demonstrate no significant findings. Lungs/Pleura: There are multiple small bilateral pulmonary nodules, for example a 2 mm nodule of the left upper lobe (series 4, image 21). There are additional, clearly calcified benign small pulmonary nodules. No pleural effusion or pneumothorax. Upper Abdomen: No acute abnormality. Musculoskeletal: No chest wall mass or suspicious bone lesions identified.  Disc degenerative disease of the thoracic spine. IMPRESSION: 1. Examination is generally limited by breath motion artifact throughout. Within this limitation, no evidence of acute traumatic injury to the chest given reported history of falls, and no evidence of acute airspace disease. 2. There are multiple small bilateral pulmonary nodules, for example a 2 mm nodule of the left upper lobe (series 4, image 21). These are unchanged in comparison to recent CT dated 06/12/2018. There are additional, clearly calcified benign small pulmonary nodules. CT follow-up of these small pulmonary nodules is optional at 12 months if there are high risk factors for lung cancer, such as cigarette smoking. 3.  Large hiatal hernia. 4.  Coronary artery disease and aortic atherosclerosis. Electronically Signed   By: Lauralyn PrimesAlex  Bibbey M.D.   On: 07/06/2018 14:08     Medical Consultants:    None.  Anti-Infectives:   None  Subjective:    Mitchell Hummingbirddwin Danis is pleasantly confused no complaints.  Objective:    Vitals:   07/06/18 1645 07/06/18 2057 07/07/18 0630 07/07/18 0735  BP: 122/74 127/74 140/78 (!) 145/76  Pulse: 95   89  Resp:      Temp:  98.7 F (37.1 C) 97.7 F (36.5 C) 98 F (36.7 C)  TempSrc:  Axillary Oral Oral  SpO2: 100% 98%  100%    Intake/Output Summary (Last 24 hours) at 07/07/2018 0803 Last data filed at 07/06/2018 2126 Gross per 24 hour  Intake 120 ml  Output 150 ml  Net -30 ml   There were no vitals filed for  this visit.  Exam: General exam: In no acute distress. Respiratory system: Good air movement and clear to auscultation. Cardiovascular system: S1 & S2 heard, RRR. No JVD. Gastrointestinal system: Abdomen is nondistended, soft and nontender.  Central nervous system: Alert and oriented. No focal neurological deficits. Extremities: No pedal edema. Skin: No rashes, lesions or ulcers  Data Reviewed:    Labs: Basic Metabolic Panel: Recent Labs  Lab 07/03/18 1800 07/04/18 0224  07/04/18 0324 07/06/18 0258 07/07/18 0300  NA 136 137 139 141 139  K 3.6 3.8 3.5 3.5 3.5  CL 98 99 101 101 104  CO2 26 26 26 27 25   GLUCOSE 337* 258* 226* 152* 196*  BUN 10 8 8 17 18   CREATININE 1.04 0.82 0.92 0.85 0.73  CALCIUM 9.0 8.7* 8.7* 9.5 8.6*   GFR CrCl cannot be calculated (Unknown ideal weight.). Liver Function Tests: Recent Labs  Lab 07/03/18 1800 07/04/18 0324 07/06/18 0258 07/07/18 0300  AST 26 22 22 19   ALT 26 22 26 21   ALKPHOS 111 93 105 88  BILITOT 0.8 1.0 1.1 0.5  PROT 7.0 6.3* 7.6 5.9*  ALBUMIN 3.7 3.3* 4.0 3.2*   No results for input(s): LIPASE, AMYLASE in the last 168 hours. No results for input(s): AMMONIA in the last 168 hours. Coagulation profile Recent Labs  Lab 07/04/18 0224  INR 1.2   COVID-19 Labs  Recent Labs    07/06/18 0258 07/07/18 0300 07/07/18 0345  DDIMER 0.59* 0.69*  --   FERRITIN 414*  --   --   CRP 1.2*  --  <0.8    Lab Results  Component Value Date   SARSCOV2NAA POSITIVE (A) 07/03/2018    CBC: Recent Labs  Lab 07/03/18 1800 07/04/18 0224 07/05/18 1145  WBC 11.4* 9.8 9.6  NEUTROABS 8.1*  --  8.7*  HGB 13.2 12.4* 13.8  HCT 41.6 38.4* 43.1  MCV 85.2 85.3 86.5  PLT 300 271 321   Cardiac Enzymes: Recent Labs  Lab 07/04/18 0324  CKTOTAL 120   BNP (last 3 results) No results for input(s): PROBNP in the last 8760 hours. CBG: Recent Labs  Lab 07/06/18 0824 07/06/18 1318 07/06/18 1717 07/06/18 2119 07/07/18 0731  GLUCAP 187* 401* 257* 87 203*   D-Dimer: Recent Labs    07/06/18 0258 07/07/18 0300  DDIMER 0.59* 0.69*   Hgb A1c: Recent Labs    07/06/18 0935  HGBA1C 8.6*   Lipid Profile: No results for input(s): CHOL, HDL, LDLCALC, TRIG, CHOLHDL, LDLDIRECT in the last 72 hours. Thyroid function studies: No results for input(s): TSH, T4TOTAL, T3FREE, THYROIDAB in the last 72 hours.  Invalid input(s): FREET3 Anemia work up: Recent Labs    07/06/18 0258  FERRITIN 414*   Sepsis Labs:  Recent Labs  Lab 07/03/18 1800 07/04/18 0224 07/05/18 1145  PROCALCITON  --  <0.10  --   WBC 11.4* 9.8 9.6   Microbiology Recent Results (from the past 240 hour(s))  Urine culture     Status: None   Collection Time: 07/03/18  7:14 PM   Specimen: Urine, Random  Result Value Ref Range Status   Specimen Description URINE, RANDOM  Final   Special Requests NONE  Final   Culture   Final    NO GROWTH Performed at [facility], [address], [place], [place] [address]    Report Status 07/04/2018 FINAL  Final  SARS Coronavirus 2 (CEPHEID - Performed in [place] hospital lab), Hosp Order     Status:  Abnormal   Collection Time: 07/03/18  7:50 PM   Specimen: Nasopharyngeal Swab  Result Value Ref Range Status   SARS Coronavirus 2 POSITIVE (A) NEGATIVE Final    Comment: RESULT CALLED TO, READ BACK BY AND VERIFIED WITH: RN P JACSON @ 2140 07/03/18 BY S GEZAHEGN  (NOTE) If result is NEGATIVE SARS-CoV-2 target nucleic acids are NOT DETECTED. The SARS-CoV-2 RNA is generally detectable in upper and lower  respiratory specimens during the acute phase of infection. The lowest  concentration of SARS-CoV-2 viral copies this assay can detect is 250  copies / mL. A negative result does not preclude SARS-CoV-2 infection  and should not be used as the sole basis for treatment or other  patient management decisions.  A negative result may occur with  improper specimen collection / handling, submission of specimen other  than nasopharyngeal swab, presence of viral mutation(s) within the  areas targeted by this assay, and inadequate number of viral copies  (<250 copies / mL). A negative result must be combined with clinical  observations, patient history, and epidemiological information. If result is POSITIVE SARS-CoV-2 target nucleic acids are DETECT ED. The SARS-CoV-2 RNA is generally detectable in upper and lower  respiratory specimens during the acute phase of infection.  Positive   results are indicative of active infection with SARS-CoV-2.  Clinical  correlation with patient history and other diagnostic information is  necessary to determine patient infection status.  Positive results do  not rule out bacterial infection or co-infection with other viruses. If result is PRESUMPTIVE POSTIVE SARS-CoV-2 nucleic acids MAY BE PRESENT.   A presumptive positive result was obtained on the submitted specimen  and confirmed on repeat testing.  While 2019 novel coronavirus  (SARS-CoV-2) nucleic acids may be present in the submitted sample  additional confirmatory testing may be necessary for epidemiological  and / or clinical management purposes  to differentiate between  SARS-CoV-2 and other Sarbecovirus currently known to infect humans.  If clinically indicated additional testing with an alternate test  methodology (LAB74 53) is advised. The SARS-CoV-2 RNA is generally  detectable in upper and lower respiratory specimens during the acute  phase of infection. The expected result is Negative. Fact Sheet for Patients:  BoilerBrush.com.cyhttps://www.fda.gov/media/136312/download Fact Sheet for Healthcare Providers: https://pope.com/https://www.fda.gov/media/136313/download This test is not yet approved or cleared by the [place] FDA and has been authorized for detection and/or diagnosis of SARS-CoV-2 by FDA under an Emergency Use Authorization (EUA).  This EUA will remain in effect (meaning this test can be used) for the duration of the COVID-19 declaration under Section 564(b)(1) of the Act, 21 U.S.C. section 360bbb-3(b)(1), unless the authorization is terminated or revoked sooner. Performed at [facility], [address] [place], [place] [address]   Culture, blood (Routine X 2) w Reflex to ID Panel     Status: Abnormal   Collection Time: 07/04/18  2:26 AM   Specimen: BLOOD  Result Value Ref Range Status   Specimen Description BLOOD LEFT ANTECUBITAL  Final   Special Requests   Final     BOTTLES DRAWN AEROBIC AND ANAEROBIC Blood Culture adequate volume   Culture  Setup Time   Final    ANAEROBIC BOTTLE ONLY GRAM POSITIVE COCCI CRITICAL RESULT CALLED TO, READ BACK BY AND VERIFIED WITH: J MILLEN PHARMD 07/04/18 2107 JDW AEROBIC BOTTLE ONLY GRAM POSITIVE RODS CRITICAL RESULT CALLED TO, READ BACK BY AND VERIFIED WITH: V BRKY PHARMD 07/04/18 2341 JDW    Culture (A)  Final  STAPHYLOCOCCUS SPECIES (COAGULASE NEGATIVE) THE SIGNIFICANCE OF ISOLATING THIS ORGANISM FROM A SINGLE SET OF BLOOD CULTURES WHEN MULTIPLE SETS ARE DRAWN IS UNCERTAIN. PLEASE NOTIFY THE MICROBIOLOGY DEPARTMENT WITHIN ONE WEEK IF SPECIATION AND SENSITIVITIES ARE REQUIRED. BACILLUS SPECIES Standardized susceptibility testing for this organism is not available. Performed at [facility], [address], [place], [place] [address]    Report Status 07/06/2018 FINAL  Final  Blood Culture ID Panel (Reflexed)     Status: Abnormal   Collection Time: 07/04/18  2:26 AM  Result Value Ref Range Status   Enterococcus species NOT DETECTED NOT DETECTED Final   Listeria monocytogenes NOT DETECTED NOT DETECTED Final   Staphylococcus species DETECTED (A) NOT DETECTED Final    Comment: Methicillin (oxacillin) susceptible coagulase negative staphylococcus. Possible blood culture contaminant (unless isolated from more than one blood culture draw or clinical case suggests pathogenicity). No antibiotic treatment is indicated for blood  culture contaminants. CRITICAL RESULT CALLED TO, READ BACK BY AND VERIFIED WITH: Ihor Austin [facility] 07/04/18 2107 JDW    Staphylococcus aureus (BCID) NOT DETECTED NOT DETECTED Final   Methicillin resistance NOT DETECTED NOT DETECTED Final   Streptococcus species NOT DETECTED NOT DETECTED Final   Streptococcus agalactiae NOT DETECTED NOT DETECTED Final   Streptococcus pneumoniae NOT DETECTED NOT DETECTED Final   Streptococcus pyogenes NOT DETECTED NOT DETECTED Final   Acinetobacter baumannii NOT  DETECTED NOT DETECTED Final   Enterobacteriaceae species NOT DETECTED NOT DETECTED Final   Enterobacter cloacae complex NOT DETECTED NOT DETECTED Final   Escherichia coli NOT DETECTED NOT DETECTED Final   Klebsiella oxytoca NOT DETECTED NOT DETECTED Final   Klebsiella pneumoniae NOT DETECTED NOT DETECTED Final   Proteus species NOT DETECTED NOT DETECTED Final   Serratia marcescens NOT DETECTED NOT DETECTED Final   Haemophilus influenzae NOT DETECTED NOT DETECTED Final   Neisseria meningitidis NOT DETECTED NOT DETECTED Final   Pseudomonas aeruginosa NOT DETECTED NOT DETECTED Final   Candida albicans NOT DETECTED NOT DETECTED Final   Candida glabrata NOT DETECTED NOT DETECTED Final   Candida krusei NOT DETECTED NOT DETECTED Final   Candida parapsilosis NOT DETECTED NOT DETECTED Final   Candida tropicalis NOT DETECTED NOT DETECTED Final    Comment: Performed at [facility], [address], [place], [place] [address]  Culture, blood (Routine X 2) w Reflex to ID Panel     Status: None (Preliminary result)   Collection Time: 07/04/18  2:29 AM   Specimen: BLOOD RIGHT HAND  Result Value Ref Range Status   Specimen Description BLOOD RIGHT HAND  Final   Special Requests   Final    BOTTLES DRAWN AEROBIC AND ANAEROBIC Blood Culture results may not be optimal due to an inadequate volume of blood received in culture bottles   Culture   Final    NO GROWTH 3 DAYS Performed at [facility], [address], [place], [place] [address]    Report Status PENDING  Incomplete  MRSA PCR Screening     Status: Abnormal   Collection Time: 07/05/18 11:53 AM  Result Value Ref Range Status   MRSA by PCR POSITIVE (A) NEGATIVE Final    Comment:        The GeneXpert MRSA Assay (FDA approved for NASAL specimens only), is one component of a comprehensive MRSA colonization surveillance program. It is not intended to diagnose MRSA infection nor to guide or monitor treatment for MRSA infections.  RESULT CALLED TO, READ BACK BY AND  VERIFIED WITH: H.[facility] AT 1852 ON 07/05/18 BY N.THOMPSON Performed at [facility], [address], [place], [place] [address]   Culture, blood (routine x 2)     Status: None (Preliminary result)   Collection Time: 07/06/18  2:58 AM   Specimen: BLOOD  Result Value Ref Range Status   Specimen Description   Final    BLOOD RIGHT HAND Performed at [facility], [address] [place], [place] [address]    Special Requests   Final    BOTTLES DRAWN AEROBIC ONLY Blood Culture adequate volume Performed at [facility], [address], [place], [place] [address]    Culture   Final    NO GROWTH < 24 HOURS Performed at [facility], [address] [place], [place] [address]    Report Status PENDING  Incomplete  Culture, blood (routine x 2)     Status: None (Preliminary result)   Collection Time: 07/06/18  3:05 AM   Specimen: BLOOD  Result Value Ref Range Status   Specimen Description   Final    BLOOD RIGHT ANTECUBITAL Performed at [facility], [address] [place], [place] [address]    Special Requests   Final    BOTTLES DRAWN AEROBIC ONLY Blood Culture adequate volume Performed at [facility], [address] [place], [place] [address]    Culture   Final    NO GROWTH < 24 HOURS Performed at [facility], [address] [place], [place] [address]    Report Status PENDING  Incomplete     Medications:   . amLODipine  5 mg Oral Daily  . atorvastatin  40 mg Oral q1800  . cholecalciferol  1,000 Units Oral Daily  . insulin aspart  0-20 Units Subcutaneous TID WC  . insulin aspart  0-5 Units Subcutaneous QHS  . insulin detemir  10 Units Subcutaneous BID  . methylPREDNISolone (SOLU-MEDROL) injection  40 mg Intravenous Daily  . vitamin C  1,000 mg Oral Daily  . zinc sulfate  220 mg Oral Daily   Continuous Infusions:    LOS: 3 days   Marinda Elk  [facility]  07/07/2018, 8:03 AM

## 2018-07-08 LAB — COMPREHENSIVE METABOLIC PANEL
ALT: 19 U/L (ref 0–44)
AST: 15 U/L (ref 15–41)
Albumin: 3.3 g/dL — ABNORMAL LOW (ref 3.5–5.0)
Alkaline Phosphatase: 102 U/L (ref 38–126)
Anion gap: 10 (ref 5–15)
BUN: 21 mg/dL (ref 8–23)
CO2: 24 mmol/L (ref 22–32)
Calcium: 8.6 mg/dL — ABNORMAL LOW (ref 8.9–10.3)
Chloride: 101 mmol/L (ref 98–111)
Creatinine, Ser: 0.91 mg/dL (ref 0.61–1.24)
GFR calc Af Amer: >60 mL/min (ref >60)
GFR calc non Af Amer: >60 mL/min (ref >60)
Glucose, Bld: 238 mg/dL — ABNORMAL HIGH (ref 70–99)
Potassium: 3.9 mmol/L (ref 3.5–5.1)
Sodium: 135 mmol/L (ref 135–145)
Total Bilirubin: 0.5 mg/dL (ref 0.3–1.2)
Total Protein: 6.2 g/dL — ABNORMAL LOW (ref 6.5–8.1)

## 2018-07-08 LAB — GLUCOSE, CAPILLARY
Glucose-Capillary: 316 mg/dL — ABNORMAL HIGH (ref 70–99)
Glucose-Capillary: 197 mg/dL — ABNORMAL HIGH (ref 70–99)
Glucose-Capillary: 319 mg/dL — ABNORMAL HIGH (ref 70–99)

## 2018-07-08 LAB — FERRITIN: Ferritin: 269 ng/mL (ref 24–336)

## 2018-07-08 LAB — D-DIMER, QUANTITATIVE: D-Dimer, Quant: 0.49 ug/mL-FEU (ref 0.00–0.50)

## 2018-07-08 LAB — C-REACTIVE PROTEIN: CRP: 0.9 mg/dL (ref <1.0)

## 2018-07-08 MED ORDER — PREDNISONE 10 MG PO TABS: ORAL_TABLET | ORAL | 0 refills | Status: DC

## 2018-07-08 MED ORDER — TAMSULOSIN HCL 0.4 MG PO CAPS: 0.4000 mg | ORAL_CAPSULE | Freq: Every day | ORAL | 0 refills | Status: DC

## 2018-07-08 NOTE — NC FL2 (Signed)
[place] LEVEL OF CARE SCREENING TOOL     IDENTIFICATION  Patient Name: Mitchell Scott Birthdate: March 13, 1940 Sex: male Admission Date (Current Location): 07/03/2018  [facility] and [place] Number:  Herbalist and Address:         Provider Number: [id number]  Attending Physician Name and Address:  Charlynne Cousins, MD  Relative Name and Phone Number:       Current Level of Care: Hospital Recommended Level of Care: [place] Prior Approval Number:    Date Approved/Denied:   PASRR Number:   [id number]   Discharge Plan: SNF    Current Diagnoses: Patient Active Problem List   Diagnosis Date Noted  . Fall 07/03/2018  . Leukocytosis 07/03/2018  . Acute metabolic encephalopathy 40/97/3532  . CAD (coronary artery disease) 07/03/2018  . COVID-19 virus infection 07/03/2018  . SDH (subdural hematoma) ([place])   . Hypertension   . Hypercholesteremia   . Diabetes mellitus without complication ([place])     Orientation RESPIRATION BLADDER Height & Weight     Self  Normal External catheter Weight:   Height:     BEHAVIORAL SYMPTOMS/MOOD NEUROLOGICAL BOWEL NUTRITION STATUS        Diet(Heart healthy)  AMBULATORY STATUS COMMUNICATION OF NEEDS Skin   Limited Assist Verbally Normal                       Personal Care Assistance Level of Assistance  Bathing, Feeding, Dressing Bathing Assistance: Limited assistance Feeding assistance: Limited assistance Dressing Assistance: Limited assistance     Functional Limitations Info             SPECIAL CARE FACTORS FREQUENCY  PT (By licensed PT), OT (By licensed OT)     PT Frequency: 5 OT Frequency: 5            Contractures      Additional Factors Info  Code Status, Allergies Code Status Info: DNR Allergies Info: Codeine Penicillins           Current Medications (07/08/2018):  This is the current hospital active medication list Current Facility-Administered Medications   Medication Dose Route Frequency Provider Last Rate Last Dose  . acetaminophen (TYLENOL) tablet 650 mg  650 mg Oral Q6H PRN Ivor Costa, MD       Or  . acetaminophen (TYLENOL) suppository 650 mg  650 mg Rectal Q6H PRN Ivor Costa, MD      . amLODipine (NORVASC) tablet 5 mg  5 mg Oral Daily [place], Carole N, DO   5 mg at 07/08/18 9924  . atorvastatin (LIPITOR) tablet 40 mg  40 mg Oral q1800 Irene Pap N, DO   40 mg at 07/07/18 1658  . cholecalciferol (VITAMIN D3) tablet 1,000 Units  1,000 Units Oral Daily Kayleen Memos, DO   1,000 Units at 07/08/18 2683  . diphenhydrAMINE (BENADRYL) capsule 25 mg  25 mg Oral Q8H PRN Skipper Cliche A, MD   25 mg at 07/05/18 2124  . hydrALAZINE (APRESOLINE) injection 5 mg  5 mg Intravenous Q2H PRN Ivor Costa, MD      . insulin aspart (novoLOG) injection 0-20 Units  0-20 Units Subcutaneous TID WC Bonnielee Haff, MD   4 Units at 07/08/18 0933  . insulin aspart (novoLOG) injection 0-5 Units  0-5 Units Subcutaneous QHS Bonnielee Haff, MD   5 Units at 07/07/18 2054  . insulin detemir (LEVEMIR) injection 10 Units  10 Units Subcutaneous BID Charlynne Cousins, MD  10 Units at 07/08/18 0939  . ipratropium (ATROVENT HFA) inhaler 2 puff  2 puff Inhalation Q6H PRN Dow AdolphHall, Carole N, DO      . levalbuterol (XOPENEX HFA) inhaler 2 puff  2 puff Inhalation Q6H PRN Lorretta HarpNiu, Xilin, MD      . ondansetron [facility](ZOFRAN) injection 4 mg  4 mg Intravenous Q8H PRN Lorretta HarpNiu, Xilin, MD      . tamsulosin [facility](FLOMAX) capsule 0.4 mg  0.4 mg Oral Daily Marinda ElkFeliz Ortiz, Abraham, MD   0.4 mg at 07/08/18 0933  . vitamin C (ASCORBIC ACID) tablet 1,000 mg  1,000 mg Oral Daily Dow AdolphHall, Carole N, DO   1,000 mg at 07/08/18 0932  . zinc sulfate capsule 220 mg  220 mg Oral Daily Dow AdolphHall, Carole N, DO   220 mg at 07/08/18 [id number]     Discharge Medications: Please see discharge summary for a list of discharge medications.  Relevant Imaging Results:  Relevant Lab Results:   Additional Information    Donnie CoffinErin M Anai Lipson,  LCSW

## 2018-07-08 NOTE — Progress Notes (Signed)
Physical Therapy Treatment Patient Details Name: Mitchell Scott MRN: [id number] DOB: 10/22/1940 Today's Date: 07/08/2018    History of Present Illness 78 y.o. 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  Pt found to be COVID (+) and transferred to [facility].  Pt with significant PMH of HTN, MI, DM, developmental delay (cognitive and emotional).      PT Comments    The patient is very pleasant and able to participate in ambulation x 100' x 2 using rollator. Continue PT/   Follow Up Recommendations  SNF     Equipment Recommendations  None recommended by PT    Recommendations for Other Services       Precautions / Restrictions Precautions Precautions: Fall Precaution Comments: h/o frequent falls (including in the hospital) Restrictions Weight Bearing Restrictions: No    Mobility  Bed Mobility Overal bed mobility: Needs Assistance Bed Mobility: Supine to Sit;Sit to Supine     Supine to sit: HOB elevated;Supervision Sit to supine: Min assist(BLE back into bed)   General bed mobility comments: cueing for sequence due to pain in side, use of bed rails  Transfers Overall transfer level: Needs assistance Equipment used: 4-wheeled walker Transfers: Sit to/from Stand Sit to Stand: Min guard;+2 safety/equipment         General transfer comment: pulls up on rollator, but able to complete min guard A for balance  Ambulation/Gait Ambulation/Gait assistance: Min assist Gait Distance (Feet): 100 Feet(x 2) Assistive device: 4-wheeled walker Gait Pattern/deviations: Step-through pattern     General Gait Details: gait was smotthe with RW,  no falters in balance. Had pain of left side x 2 while ambulating. Stopped, turned around to sit on RW x 1 for rest break.   Stairs             Wheelchair Mobility    Modified Rankin (Stroke Patients Only)       Balance Overall balance assessment: Needs  assistance Sitting-balance support: Feet supported;No upper extremity supported Sitting balance-Leahy Scale: Good     Standing balance support: Bilateral upper extremity supported Standing balance-Leahy Scale: Poor Standing balance comment: dependent on BUE in standing                            Cognition Arousal/Alertness: Awake/alert Behavior During Therapy: WFL for tasks assessed/performed Overall Cognitive Status: History of cognitive impairments - at baseline                                 General Comments: Pt pleasant and cooperative throughout session      Exercises      General Comments General comments (skin integrity, edema, etc.): Pt initially at 100% SpO2, and at end of session was 100% SpO2 on RA      Pertinent Vitals/Pain Pain Assessment: Faces Pain Score: 10-Worst pain ever Faces Pain Scale: Hurts worst Pain Location: L side (ribs) Pain Descriptors / Indicators: Grimacing;Sharp;Shooting;Tender Pain Intervention(s): Monitored during session    Home Living                      Prior Function            PT Goals (current goals can now be found in the care plan section) Acute Rehab PT Goals Patient Stated Goal: none stated, pt's brother wants him in a safe  place where he can get therapy Progress towards PT goals: Progressing toward goals    Frequency    Min 2X/week      PT Plan Current plan remains appropriate    Co-evaluation PT/OT/SLP Co-Evaluation/Treatment: Yes Reason for Co-Treatment: For patient/therapist safety PT goals addressed during session: Mobility/safety with mobility OT goals addressed during session: ADL's and self-care      AM-PAC PT "6 Clicks" Mobility   Outcome Measure  Help needed turning from your back to your side while in a flat bed without using bedrails?: A Lot Help needed moving from lying on your back to sitting on the side of a flat bed without using bedrails?: A Lot Help  needed moving to and from a bed to a chair (including a wheelchair)?: A Lot Help needed standing up from a chair using your arms (e.g., wheelchair or bedside chair)?: A Little Help needed to walk in hospital room?: A Little Help needed climbing 3-5 steps with a railing? : A Lot 6 Click Score: 14    End of Session   Activity Tolerance: Patient tolerated treatment well Patient left: in bed;with call bell/phone within reach;with bed alarm set Nurse Communication: Mobility status PT Visit Diagnosis: Muscle weakness (generalized) (M62.81);Repeated falls (R29.6);Difficulty in walking, not elsewhere classified (R26.2);Other symptoms and signs involving the nervous system (H82.993)     Time: 7169-6789 PT Time Calculation (min) (ACUTE ONLY): 28 min  Charges:  $Gait Training: 8-22 mins                     Dane Pager [phone] Office [phone]    Claretha Cooper 07/08/2018, 2:00 PM

## 2018-07-08 NOTE — Discharge Summary (Signed)
Physician Discharge Summary  Mitchell Scott FUX:[id number] DOB: [birth year] DOA: 07/03/2018  PCP: System, Pcp Not In  Admit date: 07/03/2018 Discharge date: 07/08/2018  Admitted From: SNF Disposition:  SNF  Recommendations for Outpatient Follow-up:  1. Follow up with PCP in 1-2 weeks 2. Please obtain BMP/CBC in one week   Home Health:No Equipment/Devices:None  Discharge Condition:stable CODE STATUS:*DNR Diet recommendation: Heart Healthy  Brief/Interim Summary: 78 y.o. male past medical history significant for subdural hematoma on 02/24/2018, essential hypertension, diabetes mellitus type 2, CAD who presents with a fall, head injury and altered mental status.  Per report patient fell twice prior to presentation not sure of circumstances.  CT of the C-spine is negative for any bony fractures, chest x-ray showed cardiomegaly without an filtrates, CT of the head showed left subdural hematoma which is slightly enlarged compare to recent imaging done on 06/12/2018. new, high attenuation internal components. This now measures approximately 8 mm in maximum coronal thickness,  Discharge Diagnoses:  Principal Problem:   SDH (subdural hematoma) (HCC) Active Problems:   Hypertension   Hypercholesteremia   Diabetes mellitus without complication ([place])   Fall   Leukocytosis   Acute metabolic encephalopathy   CAD (coronary artery disease)   COVID-19 virus infection New Subdural hematoma: With a history of subdural hematoma managed at [facility]. He comes in with 2 recurrent falls a CT scan on admission showed a large hematoma neurosurgery was consulted recommended PT CT scan which showed improvement in hematoma. He has not been on any anticoagulation here in the hospital. Due to his history of multiple falls physical therapy evaluated the patient and recommended skilled nursing facility. Surgery recommended to follow-up with them as an outpatient.  1 out of 2 blood cultures  positive for staph coag negative: On admission he was started on antibiotics when cultures resulted they were stopped.  Acute metabolic encephalopathy: Likely due to subdural hematoma, resolved with conservative treatment.  COVID-19 viral infection: The patient was positive on 06/25/2018. His inflammatory markers were trending down he was started empirically on IV Solu-Medrol which she will continue a taper as an outpatient. On admission was started on IV empiric antibiotics, he remained afebrile with no leukocytosis procalcitonin was less than 0.1 so antibiotics were discontinued.  He remained afebrile here in the hospital with a saturations greater 95% on room air.  Diabetes mellitus type 2: Worsened by IV steroids, he was weaned down. His A1c was 8.6, no changes made to his medication due to his advanced dementia, goal A1c is 8.0, further titration as an outpatient by his PCP.  Essential hypertension: No changes made to his medication.  Multiple falls: Physical therapy evaluated the patient and recommended skilled nursing facility.  Discharge Instructions  Discharge Instructions    Diet - low sodium heart healthy   Complete by: As directed    Increase activity slowly   Complete by: As directed    MyChart COVID-19 home monitoring program   Complete by: Jul 08, 2018    Is the patient willing to use the [place] for home monitoring?: Yes   Temperature monitoring   Complete by: Jul 08, 2018    After how many days would you like to receive a notification of this patient's flowsheet entries?: 1     Allergies as of 07/08/2018      Reactions   Codeine    Other reaction(s): GASTRIC UPSET   Penicillins Itching, Rash   Tolerates pip/tazo  Medication List    STOP taking these medications   dextrose 40 % gel Commonly known as: GLUTOSE   insulin lispro 100 UNIT/ML injection Commonly known as: HumaLOG     TAKE these medications   atorvastatin 80 MG  tablet Commonly known as: LIPITOR Take 80 mg by mouth.   fosinopril 20 MG tablet Commonly known as: MONOPRIL Take 20 mg by mouth.   glucose blood test strip Commonly known as: FREESTYLE TEST STRIPS Use as instructed   glucose blood test strip Check BS daily. Dx: E11.9   hydrocortisone cream 1 % Apply 1 application topically 2 (two) times daily.   INS SYRINGE/NEEDLE 1CC/28G 28G X 1/2" 1 ML Misc 31 gage  To use with insulin injections daily.   Insulin Syringe-Needle U-100 31G X 5/16" 1 ML Misc Use BID for Novolin injections and PRN SS  Novolog   Levemir 100 UNIT/ML injection Generic drug: insulin detemir Inject 6 Units into the skin at bedtime.   metFORMIN 1000 MG tablet Commonly known as: GLUCOPHAGE Take 1,000 mg by mouth daily with breakfast.   NovoFine Plus 32G X 4 MM Misc Generic drug: Insulin Pen Needle See admin instructions.   ONE TOUCH ULTRA MINI w/Device Kit Frequency:   Dosage:   KIT  Instructions:Use to check BS daily.  Note:Dx:250.00   Insulin Treated   predniSONE 10 MG tablet Commonly known as: DELTASONE Takes 4 tablets for 1 days, then 3 tablets for 1 days, then 2 tabs for 1 days, then 1 tab for 1 days, and then stop.   ReliOn Ultra Thin Lancets Misc Use to check BS three times daily.   tamsulosin 0.4 MG Caps capsule Commonly known as: FLOMAX Take 1 capsule (0.4 mg total) by mouth daily.   ZyrTEC Allergy 10 MG tablet Generic drug: cetirizine Take 10 mg by mouth at bedtime.       Allergies  Allergen Reactions   Codeine     Other reaction(s): GASTRIC UPSET   Penicillins Itching and Rash    Tolerates pip/tazo    Consultations:  None   Procedures/Studies: Ct Head Wo Contrast  Result Date: 07/06/2018 CLINICAL DATA:  Follow-up subdural hematoma EXAM: CT HEAD WITHOUT CONTRAST TECHNIQUE: Contiguous axial images were obtained from the base of the skull through the vertex without intravenous contrast. COMPARISON:  07/04/2018 FINDINGS:  Brain: Left-sided subdural hematoma is again identified and unchanged in appearance. No significant increase in thickness or degree of hemorrhage is noted. Mild atrophic changes are again identified. Chronic white matter ischemic change is noted as well. No new focal hemorrhage is noted. Vascular: No hyperdense vessel or unexpected calcification. Skull: Normal. Negative for fracture or focal lesion. Sinuses/Orbits: No acute finding. Other: None. IMPRESSION: Stable left-sided subdural hematoma. No new significant hemorrhage is noted. Electronically Signed   By: Inez Catalina M.D.   On: 07/06/2018 14:09   Ct Head Wo Contrast  Result Date: 07/04/2018 CLINICAL DATA:  Subdural hemorrhage follow-up EXAM: CT HEAD WITHOUT CONTRAST TECHNIQUE: Contiguous axial images were obtained from the base of the skull through the vertex without intravenous contrast. COMPARISON:  CT head 07/03/2018 FINDINGS: Brain: Left subdural hematoma slightly improved. No new area of hemorrhage since yesterday. No midline shift. Generalized atrophy with chronic white matter ischemia. Chronic ischemia in the thalamus bilaterally. No acute infarct or mass Vascular: Negative for hyperdense vessel. Atherosclerotic calcification cavernous carotid bilaterally. Skull: Negative Sinuses/Orbits: Mild mucosal edema paranasal sinuses. Negative orbit Other: None IMPRESSION: Left-sided subdural hematoma slightly improved from yesterday. No new  hemorrhage or mass-effect compared with yesterday. Electronically Signed   By: Franchot Gallo M.D.   On: 07/04/2018 15:24   Ct Head Wo Contrast  Result Date: 07/03/2018 CLINICAL DATA:  Multiple falls, recent stroke EXAM: CT HEAD WITHOUT CONTRAST CT CERVICAL SPINE WITHOUT CONTRAST TECHNIQUE: Multidetector CT imaging of the head and cervical spine was performed following the standard protocol without intravenous contrast. Multiplanar CT image reconstructions of the cervical spine were also generated. COMPARISON:   06/12/2018, 06/05/2018 FINDINGS: CT HEAD FINDINGS Brain: There is a redemonstrated left hemispheric subdural hematoma, which has slightly enlarged compared to prior examination dated 06/12/2018, with a new, high attenuation internal components. This now measures approximately 8 mm in maximum coronal thickness, previously 6 mm. There is no significant change in approximately 3 mm left right midline shift. Underlying small-vessel white matter disease and global volume loss. Vascular: No hyperdense vessel or unexpected calcification. Skull: Normal. Negative for fracture or focal lesion. Sinuses/Orbits: No acute finding. Other: Soft tissue contusion of the left frontal scalp. CT CERVICAL SPINE FINDINGS Alignment: Normal. Skull base and vertebrae: No acute fracture. No primary bone lesion or focal pathologic process. Soft tissues and spinal canal: No prevertebral fluid or swelling. No visible canal hematoma. 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 Upper chest: Negative. Other: None. IMPRESSION: 1. There is a redemonstrated left hemispheric subdural hematoma, which has slightly enlarged compared to prior examination dated 06/12/2018, with a new, high attenuation internal components. This now measures approximately 8 mm in maximum coronal thickness, previously 6 mm. There is no significant change in approximately 3 mm left right midline shift. Underlying small-vessel white matter disease and global volume loss. 2.  Soft tissue contusion of the left frontal scalp. 3. No fracture or static subluxation of the cervical spine. 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 Electronically Signed   By: Eddie Candle M.D.   On: 07/03/2018 18:30   Ct Chest Wo Contrast  Result Date: 07/06/2018 CLINICAL DATA:  Falls EXAM: CT CHEST WITHOUT CONTRAST TECHNIQUE: Multidetector CT imaging of the chest was performed following  the standard protocol without IV contrast. COMPARISON:  06/12/2018 FINDINGS: Evaluation is generally limited by breath motion artifact throughout. Cardiovascular: Aortic atherosclerosis. Three-vessel coronary artery calcifications. Normal heart size. No pericardial effusion. Mediastinum/Nodes: No enlarged mediastinal, hilar, or axillary lymph nodes. Large hiatal hernia. Thyroid gland, trachea, and esophagus demonstrate no significant findings. Lungs/Pleura: There are multiple small bilateral pulmonary nodules, for example a 2 mm nodule of the left upper lobe (series 4, image 21). There are additional, clearly calcified benign small pulmonary nodules. No pleural effusion or pneumothorax. Upper Abdomen: No acute abnormality. Musculoskeletal: No chest wall mass or suspicious bone lesions identified. Disc degenerative disease of the thoracic spine. IMPRESSION: 1. Examination is generally limited by breath motion artifact throughout. Within this limitation, no evidence of acute traumatic injury to the chest given reported history of falls, and no evidence of acute airspace disease. 2. There are multiple small bilateral pulmonary nodules, for example a 2 mm nodule of the left upper lobe (series 4, image 21). These are unchanged in comparison to recent CT dated 06/12/2018. There are additional, clearly calcified benign small pulmonary nodules. CT follow-up of these small pulmonary nodules is optional at 12 months if there are high risk factors for lung cancer, such as cigarette smoking. 3.  Large hiatal hernia. 4.  Coronary artery disease and aortic atherosclerosis. Electronically Signed  By: Eddie Candle M.D.   On: 07/06/2018 14:08   Ct Cervical Spine Wo Contrast  Result Date: 07/03/2018 CLINICAL DATA:  Multiple falls, recent stroke EXAM: CT HEAD WITHOUT CONTRAST CT CERVICAL SPINE WITHOUT CONTRAST TECHNIQUE: Multidetector CT imaging of the head and cervical spine was performed following the standard protocol without  intravenous contrast. Multiplanar CT image reconstructions of the cervical spine were also generated. COMPARISON:  06/12/2018, 06/05/2018 FINDINGS: CT HEAD FINDINGS Brain: There is a redemonstrated left hemispheric subdural hematoma, which has slightly enlarged compared to prior examination dated 06/12/2018, with a new, high attenuation internal components. This now measures approximately 8 mm in maximum coronal thickness, previously 6 mm. There is no significant change in approximately 3 mm left right midline shift. Underlying small-vessel white matter disease and global volume loss. Vascular: No hyperdense vessel or unexpected calcification. Skull: Normal. Negative for fracture or focal lesion. Sinuses/Orbits: No acute finding. Other: Soft tissue contusion of the left frontal scalp. CT CERVICAL SPINE FINDINGS Alignment: Normal. Skull base and vertebrae: No acute fracture. No primary bone lesion or focal pathologic process. Soft tissues and spinal canal: No prevertebral fluid or swelling. No visible canal hematoma. 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 Upper chest: Negative. Other: None. IMPRESSION: 1. There is a redemonstrated left hemispheric subdural hematoma, which has slightly enlarged compared to prior examination dated 06/12/2018, with a new, high attenuation internal components. This now measures approximately 8 mm in maximum coronal thickness, previously 6 mm. There is no significant change in approximately 3 mm left right midline shift. Underlying small-vessel white matter disease and global volume loss. 2.  Soft tissue contusion of the left frontal scalp. 3. No fracture or static subluxation of the cervical spine. 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 Electronically Signed   By: Eddie Candle M.D.   On: 07/03/2018 18:30   Dg Chest Portable 1 View  Result Date: 07/03/2018 CLINICAL  DATA:  Fall EXAM: PORTABLE CHEST 1 VIEW COMPARISON:  None. FINDINGS: Cardiomegaly. Lungs clear. No effusions or edema. No acute bony abnormality. IMPRESSION: Cardiomegaly.  No active disease. Electronically Signed   By: Rolm Baptise M.D.   On: 07/03/2018 19:30   Subjective: Sleeping comfortably.  Discharge Exam: Vitals:   07/08/18 0600 07/08/18 0813  BP:  (!) 141/84  Pulse: 60   Resp:    Temp:  97.6 F (36.4 C)  SpO2: 96%    Vitals:   07/08/18 0420 07/08/18 0500 07/08/18 0600 07/08/18 0813  BP: (!) 162/79   (!) 141/84  Pulse: 76 75 60   Resp: 19     Temp: 98.1 F (36.7 C)   97.6 F (36.4 C)  TempSrc: Oral   Axillary  SpO2: 100% 98% 96%     General: , not in acute distress Cardiovascular: RRR, S1/S2 +, no rubs, no gallops Respiratory: CTA bilaterally, no wheezing, no rhonchi Abdominal: Soft, NT, ND, bowel sounds + Extremities: no edema, no cyanosis    The results of significant diagnostics from this hospitalization (including imaging, microbiology, ancillary and laboratory) are listed below for reference.     Microbiology: Recent Results (from the past 240 hour(s))  Urine culture     Status: None   Collection Time: 07/03/18  7:14 PM   Specimen: Urine, Random  Result Value Ref Range Status   Specimen Description URINE, RANDOM  Final   Special Requests NONE  Final   Culture  Final    NO GROWTH Performed at [facility], [place] [address], [place], [place] [address]    Report Status 07/04/2018 FINAL  Final  SARS Coronavirus 2 (CEPHEID - Performed in [place] hospital lab), Hosp Order     Status: Abnormal   Collection Time: 07/03/18  7:50 PM   Specimen: Nasopharyngeal Swab  Result Value Ref Range Status   SARS Coronavirus 2 POSITIVE (A) NEGATIVE Final    Comment: RESULT CALLED TO, READ BACK BY AND VERIFIED WITH: RN P JACSON @ 2140 07/03/18 BY S GEZAHEGN  (NOTE) If result is NEGATIVE SARS-CoV-2 target nucleic acids are NOT DETECTED. The SARS-CoV-2 RNA is  generally detectable in upper and lower  respiratory specimens during the acute phase of infection. The lowest  concentration of SARS-CoV-2 viral copies this assay can detect is 250  copies / mL. A negative result does not preclude SARS-CoV-2 infection  and should not be used as the sole basis for treatment or other  patient management decisions.  A negative result may occur with  improper specimen collection / handling, submission of specimen other  than nasopharyngeal swab, presence of viral mutation(s) within the  areas targeted by this assay, and inadequate number of viral copies  (<250 copies / mL). A negative result must be combined with clinical  observations, patient history, and epidemiological information. If result is POSITIVE SARS-CoV-2 target nucleic acids are DETECT ED. The SARS-CoV-2 RNA is generally detectable in upper and lower  respiratory specimens during the acute phase of infection.  Positive  results are indicative of active infection with SARS-CoV-2.  Clinical  correlation with patient history and other diagnostic information is  necessary to determine patient infection status.  Positive results do  not rule out bacterial infection or co-infection with other viruses. If result is PRESUMPTIVE POSTIVE SARS-CoV-2 nucleic acids MAY BE PRESENT.   A presumptive positive result was obtained on the submitted specimen  and confirmed on repeat testing.  While 2019 novel coronavirus  (SARS-CoV-2) nucleic acids may be present in the submitted sample  additional confirmatory testing may be necessary for epidemiological  and / or clinical management purposes  to differentiate between  SARS-CoV-2 and other Sarbecovirus currently known to infect humans.  If clinically indicated additional testing with an alternate test  methodology (LAB74 [id number]) is advised. The SARS-CoV-2 RNA is generally  detectable in upper and lower respiratory specimens during the acute  phase of  infection. The expected result is Negative. Fact Sheet for Patients:  StrictlyIdeas.no Fact Sheet for Healthcare Providers: BankingDealers.co.za This test is not yet approved or cleared by the [place] FDA and has been authorized for detection and/or diagnosis of SARS-CoV-2 by FDA under an Emergency Use Authorization (EUA).  This EUA will remain in effect (meaning this test can be used) for the duration of the COVID-19 declaration under Section 564(b)(1) of the Act, 21 U.S.C. section 360bbb-3(b)(1), unless the authorization is terminated or revoked sooner. Performed at [facility], [place] [address], [place], [place] [address]   Culture, blood (Routine X 2) w Reflex to ID Panel     Status: Abnormal   Collection Time: 07/04/18  2:26 AM   Specimen: BLOOD  Result Value Ref Range Status   Specimen Description BLOOD LEFT ANTECUBITAL  Final   Special Requests   Final    BOTTLES DRAWN AEROBIC AND ANAEROBIC Blood Culture adequate volume   Culture  Setup Time   Final    ANAEROBIC BOTTLE ONLY GRAM  POSITIVE COCCI CRITICAL RESULT CALLED TO, READ BACK BY AND VERIFIED WITH: J MILLEN PHARMD 07/04/18 2107 JDW AEROBIC BOTTLE ONLY GRAM POSITIVE RODS CRITICAL RESULT CALLED TO, READ BACK BY AND VERIFIED WITH: V BRKY PHARMD 07/04/18 2341 JDW    Culture (A)  Final    STAPHYLOCOCCUS SPECIES (COAGULASE NEGATIVE) THE SIGNIFICANCE OF ISOLATING THIS ORGANISM FROM A SINGLE SET OF BLOOD CULTURES WHEN MULTIPLE SETS ARE DRAWN IS UNCERTAIN. PLEASE NOTIFY THE MICROBIOLOGY DEPARTMENT WITHIN ONE WEEK IF SPECIATION AND SENSITIVITIES ARE REQUIRED. BACILLUS SPECIES Standardized susceptibility testing for this organism is not available. Performed at [facility], [place] [address], [place], [place] [address]    Report Status 07/06/2018 FINAL  Final  Blood Culture ID Panel (Reflexed)     Status: Abnormal   Collection Time: 07/04/18  2:26 AM  Result Value Ref  Range Status   Enterococcus species NOT DETECTED NOT DETECTED Final   Listeria monocytogenes NOT DETECTED NOT DETECTED Final   Staphylococcus species DETECTED (A) NOT DETECTED Final    Comment: Methicillin (oxacillin) susceptible coagulase negative staphylococcus. Possible blood culture contaminant (unless isolated from more than one blood culture draw or clinical case suggests pathogenicity). No antibiotic treatment is indicated for blood  culture contaminants. CRITICAL RESULT CALLED TO, READ BACK BY AND VERIFIED WITH: Dion Body [facility] 07/04/18 2107 JDW    Staphylococcus aureus (BCID) NOT DETECTED NOT DETECTED Final   Methicillin resistance NOT DETECTED NOT DETECTED Final   Streptococcus species NOT DETECTED NOT DETECTED Final   Streptococcus agalactiae NOT DETECTED NOT DETECTED Final   Streptococcus pneumoniae NOT DETECTED NOT DETECTED Final   Streptococcus pyogenes NOT DETECTED NOT DETECTED Final   Acinetobacter baumannii NOT DETECTED NOT DETECTED Final   Enterobacteriaceae species NOT DETECTED NOT DETECTED Final   Enterobacter cloacae complex NOT DETECTED NOT DETECTED Final   Escherichia coli NOT DETECTED NOT DETECTED Final   Klebsiella oxytoca NOT DETECTED NOT DETECTED Final   Klebsiella pneumoniae NOT DETECTED NOT DETECTED Final   Proteus species NOT DETECTED NOT DETECTED Final   Serratia marcescens NOT DETECTED NOT DETECTED Final   Haemophilus influenzae NOT DETECTED NOT DETECTED Final   Neisseria meningitidis NOT DETECTED NOT DETECTED Final   Pseudomonas aeruginosa NOT DETECTED NOT DETECTED Final   Candida albicans NOT DETECTED NOT DETECTED Final   Candida glabrata NOT DETECTED NOT DETECTED Final   Candida krusei NOT DETECTED NOT DETECTED Final   Candida parapsilosis NOT DETECTED NOT DETECTED Final   Candida tropicalis NOT DETECTED NOT DETECTED Final    Comment: Performed at [facility], [place]. [address], [place], [place] [address]  Culture, blood (Routine X 2) w Reflex to  ID Panel     Status: None (Preliminary result)   Collection Time: 07/04/18  2:29 AM   Specimen: BLOOD RIGHT HAND  Result Value Ref Range Status   Specimen Description BLOOD RIGHT HAND  Final   Special Requests   Final    BOTTLES DRAWN AEROBIC AND ANAEROBIC Blood Culture results may not be optimal due to an inadequate volume of blood received in culture bottles   Culture   Final    NO GROWTH 4 DAYS Performed at [facility], [place] [address] [place], [place] [address]    Report Status PENDING  Incomplete  MRSA PCR Screening     Status: Abnormal   Collection Time: 07/05/18 11:53 AM  Result Value Ref Range Status   MRSA by PCR POSITIVE (A) NEGATIVE Final    Comment:  The GeneXpert MRSA Assay (FDA approved for NASAL specimens only), is one component of a comprehensive MRSA colonization surveillance program. It is not intended to diagnose MRSA infection nor to guide or monitor treatment for MRSA infections. RESULT CALLED TO, READ BACK BY AND VERIFIED WITH: H.[facility] AT 1852 ON 07/05/18 BY N.THOMPSON Performed at [facility], [place] [address] [place], [place] [address]   Culture, blood (routine x 2)     Status: None (Preliminary result)   Collection Time: 07/06/18  2:58 AM   Specimen: BLOOD  Result Value Ref Range Status   Specimen Description   Final    BLOOD RIGHT HAND Performed at [place] [address], [place], [place] [address]    Special Requests   Final    BOTTLES DRAWN AEROBIC ONLY Blood Culture adequate volume Performed at [place] [address], [place], [place] [address]    Culture   Final    NO GROWTH 2 DAYS Performed at [place] [address] [place], [place] [address]    Report Status PENDING  Incomplete  Culture, blood (routine x 2)     Status: None (Preliminary result)   Collection Time: 07/06/18  3:05 AM   Specimen: BLOOD  Result Value Ref Range Status   Specimen  Description   Final    BLOOD RIGHT ANTECUBITAL Performed at [facility], [place] [address], [place], [place] [address]    Special Requests   Final    BOTTLES DRAWN AEROBIC ONLY Blood Culture adequate volume Performed at [place] [address] [place], [place] [address]    Culture   Final    NO GROWTH 2 DAYS Performed at [place] [address], [place], [place] [address]    Report Status PENDING  Incomplete     Labs: BNP (last 3 results) Recent Labs    07/04/18 0229  BNP 35.4   Basic Metabolic Panel: Recent Labs  Lab 07/04/18 0224 07/04/18 0324 07/06/18 0258 07/07/18 0300 07/08/18 0335  NA 137 139 141 139 135  K 3.8 3.5 3.5 3.5 3.9  CL 99 101 101 104 101  CO2 '26 26 27 25 24  ' GLUCOSE 258* 226* 152* 196* 238*  BUN '8 8 17 18 21  ' CREATININE 0.82 0.92 0.85 0.73 0.91  CALCIUM 8.7* 8.7* 9.5 8.6* 8.6*   Liver Function Tests: Recent Labs  Lab 07/03/18 1800 07/04/18 0324 07/06/18 0258 07/07/18 0300 07/08/18 0335  AST '26 22 22 19 15  ' ALT '26 22 26 21 19  ' ALKPHOS 111 93 105 88 102  BILITOT 0.8 1.0 1.1 0.5 0.5  PROT 7.0 6.3* 7.6 5.9* 6.2*  ALBUMIN 3.7 3.3* 4.0 3.2* 3.3*   No results for input(s): LIPASE, AMYLASE in the last 168 hours. No results for input(s): AMMONIA in the last 168 hours. CBC: Recent Labs  Lab 07/03/18 1800 07/04/18 0224 07/05/18 1145  WBC 11.4* 9.8 9.6  NEUTROABS 8.1*  --  8.7*  HGB 13.2 12.4* 13.8  HCT 41.6 38.4* 43.1  MCV 85.2 85.3 86.5  PLT 300 271 321   Cardiac Enzymes: Recent Labs  Lab 07/04/18 0324  CKTOTAL 120   BNP: Invalid input(s): POCBNP CBG: Recent Labs  Lab 07/07/18 0731 07/07/18 1116 07/07/18 1953 07/07/18 2045 07/08/18 0811  GLUCAP 203* 244* 453* 383* 197*   D-Dimer Recent Labs    07/07/18 0300 07/08/18 0335  DDIMER 0.69* 0.49   Hgb A1c  Recent Labs    07/06/18 0935  HGBA1C 8.6*   Lipid Profile No results for input(s): CHOL, HDL, LDLCALC, TRIG, CHOLHDL,  LDLDIRECT in the last 72 hours. Thyroid function studies No results for input(s): TSH, T4TOTAL, T3FREE, THYROIDAB in the last 72 hours.  Invalid input(s): FREET3 Anemia work up Recent Labs    07/07/18 0345 07/08/18 0415  FERRITIN 256 269   Urinalysis    Component Value Date/Time   COLORURINE YELLOW 07/03/2018 1924   APPEARANCEUR HAZY (A) 07/03/2018 1924   LABSPEC 1.023 07/03/2018 1924   PHURINE 5.0 07/03/2018 1924   GLUCOSEU >=500 (A) 07/03/2018 1924   HGBUR NEGATIVE 07/03/2018 1924   BILIRUBINUR NEGATIVE 07/03/2018 1924   KETONESUR 5 (A) 07/03/2018 1924   PROTEINUR NEGATIVE 07/03/2018 1924   NITRITE NEGATIVE 07/03/2018 1924   LEUKOCYTESUR NEGATIVE 07/03/2018 1924   Sepsis Labs Invalid input(s): PROCALCITONIN,  WBC,  LACTICIDVEN Microbiology Recent Results (from the past 240 hour(s))  Urine culture     Status: None   Collection Time: 07/03/18  7:14 PM   Specimen: Urine, Random  Result Value Ref Range Status   Specimen Description URINE, RANDOM  Final   Special Requests NONE  Final   Culture   Final    NO GROWTH Performed at [facility], [address] [place], [place] [address]    Report Status 07/04/2018 FINAL  Final  SARS Coronavirus 2 (CEPHEID - Performed in [facility] lab), Hosp Order     Status: Abnormal   Collection Time: 07/03/18  7:50 PM   Specimen: Nasopharyngeal Swab  Result Value Ref Range Status   SARS Coronavirus 2 POSITIVE (A) NEGATIVE Final    Comment: RESULT CALLED TO, READ BACK BY AND VERIFIED WITH: RN P JACSON @ 2140 07/03/18 BY S GEZAHEGN  (NOTE) If result is NEGATIVE SARS-CoV-2 target nucleic acids are NOT DETECTED. The SARS-CoV-2 RNA is generally detectable in upper and lower  respiratory specimens during the acute phase of infection. The lowest  concentration of SARS-CoV-2 viral copies this assay can detect is 250  copies / mL. A negative result does not preclude SARS-CoV-2 infection  and should not be used as the sole  basis for treatment or other  patient management decisions.  A negative result may occur with  improper specimen collection / handling, submission of specimen other  than nasopharyngeal swab, presence of viral mutation(s) within the  areas targeted by this assay, and inadequate number of viral copies  (<250 copies / mL). A negative result must be combined with clinical  observations, patient history, and epidemiological information. If result is POSITIVE SARS-CoV-2 target nucleic acids are DETECT ED. The SARS-CoV-2 RNA is generally detectable in upper and lower  respiratory specimens during the acute phase of infection.  Positive  results are indicative of active infection with SARS-CoV-2.  Clinical  correlation with patient history and other diagnostic information is  necessary to determine patient infection status.  Positive results do  not rule out bacterial infection or co-infection with other viruses. If result is PRESUMPTIVE POSTIVE SARS-CoV-2 nucleic acids MAY BE PRESENT.   A presumptive positive result was obtained on the submitted specimen  and confirmed on repeat testing.  While 2019 novel coronavirus  (SARS-CoV-2) nucleic acids may be present in the submitted sample  additional confirmatory testing may be necessary for epidemiological  and / or clinical management purposes  to differentiate between  SARS-CoV-2 and other Sarbecovirus currently known to infect humans.  If clinically indicated additional  testing with an alternate test  methodology (LAB74 53) is advised. The SARS-CoV-2 RNA is generally  detectable in upper and lower respiratory specimens during the acute  phase of infection. The expected result is Negative. Fact Sheet for Patients:  StrictlyIdeas.no Fact Sheet for Healthcare Providers: BankingDealers.co.za This test is not yet approved or cleared by the [place] FDA and has been authorized for detection  and/or diagnosis of SARS-CoV-2 by FDA under an Emergency Use Authorization (EUA).  This EUA will remain in effect (meaning this test can be used) for the duration of the COVID-19 declaration under Section 564(b)(1) of the Act, 21 U.S.C. section 360bbb-3(b)(1), unless the authorization is terminated or revoked sooner. Performed at [facility], [place] [address] [place], [place] [address]   Culture, blood (Routine X 2) w Reflex to ID Panel     Status: Abnormal   Collection Time: 07/04/18  2:26 AM   Specimen: BLOOD  Result Value Ref Range Status   Specimen Description BLOOD LEFT ANTECUBITAL  Final   Special Requests   Final    BOTTLES DRAWN AEROBIC AND ANAEROBIC Blood Culture adequate volume   Culture  Setup Time   Final    ANAEROBIC BOTTLE ONLY GRAM POSITIVE COCCI CRITICAL RESULT CALLED TO, READ BACK BY AND VERIFIED WITH: J MILLEN PHARMD 07/04/18 2107 JDW AEROBIC BOTTLE ONLY GRAM POSITIVE RODS CRITICAL RESULT CALLED TO, READ BACK BY AND VERIFIED WITH: V BRKY PHARMD 07/04/18 2341 JDW    Culture (A)  Final    STAPHYLOCOCCUS SPECIES (COAGULASE NEGATIVE) THE SIGNIFICANCE OF ISOLATING THIS ORGANISM FROM A SINGLE SET OF BLOOD CULTURES WHEN MULTIPLE SETS ARE DRAWN IS UNCERTAIN. PLEASE NOTIFY THE MICROBIOLOGY DEPARTMENT WITHIN ONE WEEK IF SPECIATION AND SENSITIVITIES ARE REQUIRED. BACILLUS SPECIES Standardized susceptibility testing for this organism is not available. Performed at [facility], [place] [address], [place], [place] [address]    Report Status 07/06/2018 FINAL  Final  Blood Culture ID Panel (Reflexed)     Status: Abnormal   Collection Time: 07/04/18  2:26 AM  Result Value Ref Range Status   Enterococcus species NOT DETECTED NOT DETECTED Final   Listeria monocytogenes NOT DETECTED NOT DETECTED Final   Staphylococcus species DETECTED (A) NOT DETECTED Final    Comment: Methicillin (oxacillin) susceptible coagulase negative staphylococcus. Possible blood culture contaminant  (unless isolated from more than one blood culture draw or clinical case suggests pathogenicity). No antibiotic treatment is indicated for blood  culture contaminants. CRITICAL RESULT CALLED TO, READ BACK BY AND VERIFIED WITH: Dion [facility] 07/04/18 2107 JDW    Staphylococcus aureus (BCID) NOT DETECTED NOT DETECTED Final   Methicillin resistance NOT DETECTED NOT DETECTED Final   Streptococcus species NOT DETECTED NOT DETECTED Final   Streptococcus agalactiae NOT DETECTED NOT DETECTED Final   Streptococcus pneumoniae NOT DETECTED NOT DETECTED Final   Streptococcus pyogenes NOT DETECTED NOT DETECTED Final   Acinetobacter baumannii NOT DETECTED NOT DETECTED Final   Enterobacteriaceae species NOT DETECTED NOT DETECTED Final   Enterobacter cloacae complex NOT DETECTED NOT DETECTED Final   Escherichia coli NOT DETECTED NOT DETECTED Final   Klebsiella oxytoca NOT DETECTED NOT DETECTED Final   Klebsiella pneumoniae NOT DETECTED NOT DETECTED Final   Proteus species NOT DETECTED NOT DETECTED Final   Serratia marcescens NOT DETECTED NOT DETECTED Final   Haemophilus influenzae NOT DETECTED NOT DETECTED Final   Neisseria meningitidis NOT DETECTED NOT DETECTED Final   Pseudomonas aeruginosa NOT DETECTED NOT DETECTED Final   Candida albicans  NOT DETECTED NOT DETECTED Final   Candida glabrata NOT DETECTED NOT DETECTED Final   Candida krusei NOT DETECTED NOT DETECTED Final   Candida parapsilosis NOT DETECTED NOT DETECTED Final   Candida tropicalis NOT DETECTED NOT DETECTED Final    Comment: Performed at [facility], [place] [address], [place], [place] [address]  Culture, blood (Routine X 2) w Reflex to ID Panel     Status: None (Preliminary result)   Collection Time: 07/04/18  2:29 AM   Specimen: BLOOD RIGHT HAND  Result Value Ref Range Status   Specimen Description BLOOD RIGHT HAND  Final   Special Requests   Final    BOTTLES DRAWN AEROBIC AND ANAEROBIC Blood Culture results may not be optimal  due to an inadequate volume of blood received in culture bottles   Culture   Final    NO GROWTH 4 DAYS Performed at [facility], [place] [address], [place], [place] [address]    Report Status PENDING  Incomplete  MRSA PCR Screening     Status: Abnormal   Collection Time: 07/05/18 11:53 AM  Result Value Ref Range Status   MRSA by PCR POSITIVE (A) NEGATIVE Final    Comment:        The GeneXpert MRSA Assay (FDA approved for NASAL specimens only), is one component of a comprehensive MRSA colonization surveillance program. It is not intended to diagnose MRSA infection nor to guide or monitor treatment for MRSA infections. RESULT CALLED TO, READ BACK BY AND VERIFIED WITH: H.[facility] AT 1852 ON 07/05/18 BY N.THOMPSON Performed at [facility], [place] [address] [place], [place] [address]   Culture, blood (routine x 2)     Status: None (Preliminary result)   Collection Time: 07/06/18  2:58 AM   Specimen: BLOOD  Result Value Ref Range Status   Specimen Description   Final    BLOOD RIGHT HAND Performed at [facility] [address], [place], [place] [address]    Special Requests   Final    BOTTLES DRAWN AEROBIC ONLY Blood Culture adequate volume Performed at [facility] [address], [place], [place] [address]    Culture   Final    NO GROWTH 2 DAYS Performed at [facility] [address], [place], [place] [address]    Report Status PENDING  Incomplete  Culture, blood (routine x 2)     Status: None (Preliminary result)   Collection Time: 07/06/18  3:05 AM   Specimen: BLOOD  Result Value Ref Range Status   Specimen Description   Final    BLOOD RIGHT ANTECUBITAL Performed at [facility], [place] [address], [place], [place] [address]    Special Requests   Final    BOTTLES DRAWN AEROBIC ONLY Blood Culture adequate volume Performed at [facility] [address], [place], [place] [address]     Culture   Final    NO GROWTH 2 DAYS Performed at [facility] [address], [place], [place] [address]    Report Status PENDING  Incomplete     Time coordinating discharge: Over 40 minutes  SIGNED:   Charlynne Cousins, MD  [facility] 07/08/2018, 9:30 AM Pager   If 7PM-7AM, please contact night-coverage [email or web address] Password [email or web address]

## 2018-07-08 NOTE — Progress Notes (Signed)
Called report to [facility]  place- number is [phone]

## 2018-07-08 NOTE — Progress Notes (Signed)
Occupational Therapy Treatment Patient Details Name: Mitchell Scott MRN: [id number] DOB: 05-15-1940 Today's Date: 07/08/2018    History of present illness 78 y.o. 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  Pt found to be COVID (+) and transferred to [place].  Pt with significant PMH of HTN, MI, DM, developmental delay (cognitive and emotional).     OT comments  Pt with new left sided pain, but willing to work with therapy. Pt 100% SpO2 throughout session on RA. Limited access to LB for ADL due to pain in Left side, did VERY VERY well with Rollator this session, able to ambulate in hall - progressed much further than anticipated (see PT note for more details). Pt was then returned supine, put more in chair position, and set up for eating lunch. Pt was successfully eating lunch with regular utensils when OT left. POC remains appropriate at this time!   Follow Up Recommendations  SNF;Supervision/Assistance - 24 hour    Equipment Recommendations    Rollator   Recommendations for Other Services      Precautions / Restrictions Precautions Precautions: Fall Precaution Comments: h/o frequent falls (including in the hospital) Restrictions Weight Bearing Restrictions: No       Mobility Bed Mobility Overal bed mobility: Needs Assistance Bed Mobility: Supine to Sit;Sit to Supine     Supine to sit: HOB elevated;Supervision Sit to supine: Min assist(BLE back into bed)   General bed mobility comments: cueing for sequence due to pain in side, use of bed rails  Transfers Overall transfer level: Needs assistance Equipment used: 4-wheeled walker Transfers: Sit to/from Stand Sit to Stand: Min guard;+2 safety/equipment         General transfer comment: pulls up on rollator, but able to complete min guard A for balance    Balance Overall balance assessment: Needs assistance Sitting-balance support: Feet supported;No upper extremity  supported Sitting balance-Leahy Scale: Good     Standing balance support: Bilateral upper extremity supported Standing balance-Leahy Scale: Poor Standing balance comment: dependent on BUE in standing                           ADL either performed or assessed with clinical judgement   ADL Overall ADL's : Needs assistance/impaired Eating/Feeding: Supervision/ safety;Sitting Eating/Feeding Details (indicate cue type and reason): in bed at end of session Grooming: Set up;Wash/dry hands;Wash/dry face;Sitting                   Toilet Transfer: Min guard;Ambulation(rollator) Armed forces technical officer Details (indicate cue type and reason): no posterior lean today, much improved strength and balance from previous session         Functional mobility during ADLs: Min guard(Rollator)       Vision   Vision Assessment?: Vision impaired- to be further tested in functional context   Perception     Praxis      Cognition Arousal/Alertness: Awake/alert Behavior During Therapy: WFL for tasks assessed/performed Overall Cognitive Status: History of cognitive impairments - at baseline                                 General Comments: Pt pleasant and cooperative throughout session        Exercises     Shoulder Instructions       General Comments Pt initially at 100% SpO2, and at end  of session was 100% SpO2 on RA    Pertinent Vitals/ Pain       Pain Assessment: Faces Pain Score: 10-Worst pain ever Faces Pain Scale: Hurts worst Pain Location: L side (ribs) Pain Descriptors / Indicators: Grimacing;Sharp;Shooting;Tender Pain Intervention(s): Monitored during session;Repositioned  Home Living                                          Prior Functioning/Environment              Frequency  Min 2X/week        Progress Toward Goals  OT Goals(current goals can now be found in the care plan section)  Progress towards OT goals:  Progressing toward goals  Acute Rehab OT Goals Patient Stated Goal: none stated, pt's brother wants him in a safe place where he can get therapy OT Goal Formulation: Patient unable to participate in goal setting Time For Goal Achievement: 07/20/18 Potential to Achieve Goals: Fair  Plan Discharge plan remains appropriate;Frequency remains appropriate    Co-evaluation    PT/OT/SLP Co-Evaluation/Treatment: Yes Reason for Co-Treatment: Necessary to address cognition/behavior during functional activity;For patient/therapist safety;To address functional/ADL transfers          AM-PAC OT "6 Clicks" Daily Activity     Outcome Measure   Help from another person eating meals?: A Little Help from another person taking care of personal grooming?: A Little Help from another person toileting, which includes using toliet, bedpan, or urinal?: A Lot Help from another person bathing (including washing, rinsing, drying)?: A Lot Help from another person to put on and taking off regular upper body clothing?: A Little Help from another person to put on and taking off regular lower body clothing?: A Lot 6 Click Score: 15    End of Session Equipment Utilized During Treatment: Gait belt(Rollator)  OT Visit Diagnosis: Unsteadiness on feet (R26.81);Other abnormalities of gait and mobility (R26.89);Repeated falls (R29.6);Muscle weakness (generalized) (M62.81);History of falling (Z91.81);Other symptoms and signs involving the nervous system (R29.898);Dizziness and giddiness (R42)   Activity Tolerance Patient tolerated treatment well   Patient Left in bed;with bed alarm set;with call bell/phone within reach   Nurse Communication Mobility status        Time: [id number] OT Time Calculation (min): 38 min  Charges: OT General Charges $OT Visit: 1 Visit OT Treatments $Self Care/Home Management : 8-22 mins $Therapeutic Activity: 8-22 mins Sherryl MangesLaura Saher Davee OTR/L Acute Rehabilitation Services Pager:  [phone] Office: [phone]   Evern BioLaura J Kimanh Templeman 07/08/2018, 1:36 PM

## 2018-07-08 NOTE — TOC Transition Note (Signed)
Transition of Care [facility]) - CM/SW Discharge Note   Patient Details  Name: Mitchell Scott MRN: [id number] Date of Birth: 1940/10/29  [facility] CM/SW Contact:  Weston Anna, LCSW Phone Number:  [phone] 07/08/2018, 10:59 AM   Clinical Narrative:     CSW spoke with patients brother, Berneta Sages, and confirmed patient will return to [facility] today. Brother voiced no concerns of discharge today and only questioned if patient would be received a walker at discharge- per PT notes, patient was using a walker during evaluation and should have one at bedside.   CSW spoke with [place] and facility is able to accept patient today after 2PM. Please call report to [phone]. RN notified and transport form completed.   Final next level of care: Skilled Nursing Facility Barriers to Discharge: No Barriers Identified   Patient Goals and CMS Choice Patient states their goals for this hospitalization and ongoing recovery are:: Getting stronger CMS Medicare.gov Compare Post Acute Care list provided to:: (From facility)    Discharge Placement                       Discharge Plan and Services In-house Referral: Clinical Social Work                                   Social Determinants of Health (SDOH) Interventions     Readmission Risk Interventions No flowsheet data found.

## 2018-07-09 LAB — CULTURE, BLOOD (ROUTINE X 2): Culture: NO GROWTH

## 2018-07-11 LAB — CULTURE, BLOOD (ROUTINE X 2)
Culture: NO GROWTH
Culture: NO GROWTH
Special Requests: ADEQUATE
Special Requests: ADEQUATE

## 2018-07-26 ENCOUNTER — Other Ambulatory Visit: Payer: Self-pay

## 2018-07-26 ENCOUNTER — Encounter (HOSPITAL_COMMUNITY): Payer: Self-pay

## 2018-07-26 ENCOUNTER — Emergency Department (HOSPITAL_COMMUNITY): Payer: Medicare Other

## 2018-07-26 ENCOUNTER — Emergency Department (HOSPITAL_COMMUNITY)
Admission: EM | Admit: 2018-07-26 | Discharge: 2018-07-26 | Disposition: A | Discharge status: Skilled Nursing Facility | Payer: Medicare Other | Attending: Emergency Medicine | Admitting: Emergency Medicine

## 2018-07-26 DIAGNOSIS — S0990XA Unspecified injury of head, initial encounter: Secondary | ICD-10-CM | POA: Diagnosis not present

## 2018-07-26 DIAGNOSIS — S72464A Nondisplaced supracondylar fracture with intracondylar extension of lower end of right femur, initial encounter for closed fracture: Secondary | ICD-10-CM

## 2018-07-26 DIAGNOSIS — Y92129 Unspecified place in nursing home as the place of occurrence of the external cause: Secondary | ICD-10-CM | POA: Diagnosis not present

## 2018-07-26 DIAGNOSIS — W19XXXA Unspecified fall, initial encounter: Secondary | ICD-10-CM | POA: Diagnosis not present

## 2018-07-26 DIAGNOSIS — E119 Type 2 diabetes mellitus without complications: Secondary | ICD-10-CM | POA: Diagnosis not present

## 2018-07-26 DIAGNOSIS — I10 Essential (primary) hypertension: Secondary | ICD-10-CM | POA: Insufficient documentation

## 2018-07-26 DIAGNOSIS — S42424A Nondisplaced comminuted supracondylar fracture without intercondylar fracture of right humerus, initial encounter for closed fracture: Secondary | ICD-10-CM | POA: Insufficient documentation

## 2018-07-26 DIAGNOSIS — S065X9A Traumatic subdural hemorrhage with loss of consciousness of unspecified duration, initial encounter: Secondary | ICD-10-CM

## 2018-07-26 DIAGNOSIS — Y939 Activity, unspecified: Secondary | ICD-10-CM | POA: Diagnosis not present

## 2018-07-26 DIAGNOSIS — Z79899 Other long term (current) drug therapy: Secondary | ICD-10-CM | POA: Diagnosis not present

## 2018-07-26 DIAGNOSIS — I2510 Atherosclerotic heart disease of native coronary artery without angina pectoris: Secondary | ICD-10-CM | POA: Insufficient documentation

## 2018-07-26 DIAGNOSIS — M25552 Pain in left hip: Secondary | ICD-10-CM | POA: Diagnosis not present

## 2018-07-26 DIAGNOSIS — Y999 Unspecified external cause status: Secondary | ICD-10-CM | POA: Insufficient documentation

## 2018-07-26 DIAGNOSIS — S065XAA Traumatic subdural hemorrhage with loss of consciousness status unknown, initial encounter: Secondary | ICD-10-CM

## 2018-07-26 DIAGNOSIS — S4991XA Unspecified injury of right shoulder and upper arm, initial encounter: Secondary | ICD-10-CM | POA: Diagnosis present

## 2018-07-26 LAB — BASIC METABOLIC PANEL
Anion gap: 11 (ref 5–15)
BUN: 12 mg/dL (ref 8–23)
CO2: 25 mmol/L (ref 22–32)
Calcium: 8.2 mg/dL — ABNORMAL LOW (ref 8.9–10.3)
Chloride: 103 mmol/L (ref 98–111)
Creatinine, Ser: 0.8 mg/dL (ref 0.61–1.24)
GFR calc Af Amer: >60 mL/min (ref >60)
GFR calc non Af Amer: >60 mL/min (ref >60)
Glucose, Bld: 206 mg/dL — ABNORMAL HIGH (ref 70–99)
Potassium: 3.8 mmol/L (ref 3.5–5.1)
Sodium: 139 mmol/L (ref 135–145)

## 2018-07-26 LAB — CBC WITH DIFFERENTIAL/PLATELET
Abs Immature Granulocytes: 0.02 10*3/uL (ref 0.00–0.07)
Basophils Absolute: 0.1 10*3/uL (ref 0.0–0.1)
Basophils Relative: 1 %
Eosinophils Absolute: 0.4 10*3/uL (ref 0.0–0.5)
Eosinophils Relative: 5 %
HCT: 38.4 % — ABNORMAL LOW (ref 39.0–52.0)
Hemoglobin: 12.1 g/dL — ABNORMAL LOW (ref 13.0–17.0)
Immature Granulocytes: 0 %
Lymphocytes Relative: 12 %
Lymphs Abs: 1 10*3/uL (ref 0.7–4.0)
MCH: 28.1 pg (ref 26.0–34.0)
MCHC: 31.5 g/dL (ref 30.0–36.0)
MCV: 89.1 fL (ref 80.0–100.0)
Monocytes Absolute: 0.8 10*3/uL (ref 0.1–1.0)
Monocytes Relative: 10 %
Neutro Abs: 6 10*3/uL (ref 1.7–7.7)
Neutrophils Relative %: 72 %
Platelets: 270 10*3/uL (ref 150–400)
RBC: 4.31 MIL/uL (ref 4.22–5.81)
RDW: 17.8 % — ABNORMAL HIGH (ref 11.5–15.5)
WBC: 8.2 10*3/uL (ref 4.0–10.5)
nRBC: 0 % (ref 0.0–0.2)

## 2018-07-26 LAB — TYPE AND SCREEN
ABO/RH(D): O POS
Antibody Screen: NEGATIVE

## 2018-07-26 LAB — PROTIME-INR
INR: 1.1 (ref 0.8–1.2)
Prothrombin Time: 14 seconds (ref 11.4–15.2)

## 2018-07-26 MED ORDER — SODIUM CHLORIDE 0.9 % IV SOLN
INTRAVENOUS | Status: DC
  Administered 2018-07-26: 15:00:00 via INTRAVENOUS

## 2018-07-26 NOTE — ED Triage Notes (Addendum)
Pt BIBA from [facility]. Pt had a fall yesterday and had x ray done which showed a right distal humerus fx.  EMS unable to get report from nursing home staff. Unclear if pt hit head or LOC, pt denies.

## 2018-07-26 NOTE — ED Notes (Signed)
[facility]AR here to pick patient up and transport.

## 2018-07-26 NOTE — ED Notes (Signed)
Attempted x2 to call report to [facility]

## 2018-07-26 NOTE — Discharge Instructions (Addendum)
Follow up with ortho in the office.  Follow up with your family doctor and let them know you had a fall.  This sometimes can be a sign that something needs to be addressed to prevent a future fall.

## 2018-07-26 NOTE — ED Provider Notes (Signed)
[facility] DEPT Provider Note   CSN: [id number] Arrival date & time: 07/26/18  1307     History   Chief Complaint Chief Complaint  Patient presents with   Fall    HPI Mitchell Scott is a 78 y.o. male.     HPI Pt presents to the ED for evaluation after a fall.   Pt states he fell on Thursday.  He denies LOC or hitting his head  Per EMS pt fell yesterday.  He had xrays his nursing facility which showed a possible humerus fracture but was sent to the ED for further evaluation with a.  Pt denies any fevers or chills.  No vomiting or diarrhea.  NO or SOB. Past Medical History:  Diagnosis Date   Diabetes mellitus without complication ([place])    Heart attack ([place])    Hypercholesteremia    Hypertension    Liver damage    SDH (subdural hematoma) [facility])     Patient Active Problem List   Diagnosis Date Noted   Fall 07/03/2018   Leukocytosis 40/98/1191   Acute metabolic encephalopathy 47/82/9562   CAD (coronary artery disease) 07/03/2018   COVID-19 virus infection 07/03/2018   SDH (subdural hematoma) ([facility])    Hypertension    Hypercholesteremia    Diabetes mellitus without complication ([place])     Past Surgical History:  Procedure Laterality Date   TONSILLECTOMY          Home Medications    Prior to Admission medications   Medication Sig Start Date End Date Taking? Authorizing Provider  atorvastatin (LIPITOR) 80 MG tablet Take 80 mg by mouth. 05/23/18   [provider]  Blood Glucose Monitoring Suppl (ONE TOUCH ULTRA MINI) W/DEVICE KIT Frequency:   Dosage:   KIT  Instructions:Use to check BS daily.  Note:Dx:250.00   Insulin Treated 02/01/12   [provider]  cetirizine (ZYRTEC ALLERGY) 10 MG tablet Take 10 mg by mouth at bedtime. 06/22/18 07/21/18  [provider]  fosinopril (MONOPRIL) 20 MG tablet Take 20 mg by mouth. 06/06/18   [provider]  glucose blood (FREESTYLE TEST STRIPS) test  strip Use as instructed 02/20/14   Ernestina Patches, MD  glucose blood test strip Check BS daily. Dx: E11.9 03/07/14   [provider]  hydrocortisone cream 1 % Apply 1 application topically 2 (two) times daily.    [provider]  INS SYRINGE/NEEDLE 1CC/28G 28G X 1/2" 1 ML MISC 31 gage  To use with insulin injections daily. 03/27/13   [provider]  insulin detemir (LEVEMIR) 100 UNIT/ML injection Inject 6 Units into the skin at bedtime.     [provider]  Insulin Syringe-Needle U-100 31G X 5/16" 1 ML MISC Use BID for Novolin injections and PRN SS  Novolog 10/16/13   [provider]  metFORMIN (GLUCOPHAGE) 1000 MG tablet Take 1,000 mg by mouth daily with breakfast.     [provider]  NOVOFINE PLUS 32G X 4 MM MISC See admin instructions. 03/21/14   [provider]  predniSONE (DELTASONE) 10 MG tablet Takes 4 tablets for 1 days, then 3 tablets for 1 days, then 2 tabs for 1 days, then 1 tab for 1 days, and then stop. 07/08/18   Charlynne Cousins, MD  ReliOn Ultra Thin Lancets MISC Use to check BS three times daily. 03/03/10   [provider]  tamsulosin (FLOMAX) 0.4 MG CAPS capsule Take 1 capsule (0.4 mg total) by mouth daily. 07/08/18  Charlynne Cousins, MD    Family History No family history on file.  Social History Social History   Tobacco Use   Smoking status: Never Smoker   Smokeless tobacco: Never Used  Substance Use Topics   Alcohol use: No   Drug use: No     Allergies   Codeine and Penicillins   Review of Systems Review of Systems  All other systems reviewed and are negative.    Physical Exam Updated Vital Signs BP 111/63 (BP Location: Left Arm)    Pulse 88    Temp 98.4 F (36.9 C) (Oral)    Resp 16    Ht 1.727 m ('5\' 8"' )    Wt 71.7 kg    SpO2 98%    BMI 24.02 kg/m   Physical Exam Vitals signs and nursing note reviewed.  Constitutional:      General: He is not in acute distress.     Appearance: He is well-developed.  HENT:     Head: Normocephalic.     Comments: Small contusion noted around the eye    Right Ear: External ear normal.     Left Ear: External ear normal.  Eyes:     General: No scleral icterus.       Right eye: No discharge.        Left eye: No discharge.     Conjunctiva/sclera: Conjunctivae normal.  Neck:     Musculoskeletal: Neck supple.     Trachea: No tracheal deviation.  Cardiovascular:     Rate and Rhythm: Normal rate and regular rhythm.  Pulmonary:     Effort: Pulmonary effort is normal. No respiratory distress.     Breath sounds: Normal breath sounds. No stridor. No wheezing or rales.  Abdominal:     General: Bowel sounds are normal. There is no distension.     Palpations: Abdomen is soft.     Tenderness: There is no abdominal tenderness. There is no guarding or rebound.  Musculoskeletal:     Right elbow: He exhibits decreased range of motion. He exhibits no deformity. Tenderness found.     Left hip: He exhibits tenderness.     Cervical back: Normal.     Thoracic back: Normal.     Lumbar back: Normal.     Comments: Questionable shortening of the left lower extremity, pain with range of motion; ttp and swelling distal humerus, elbow  Skin:    General: Skin is warm and dry.     Findings: No rash.  Neurological:     Mental Status: He is alert.     Cranial Nerves: No cranial nerve deficit (no facial droop, extraocular movements intact, no slurred speech).     Sensory: No sensory deficit.     Motor: No abnormal muscle tone or seizure activity.     Coordination: Coordination normal.      ED Treatments / Results  Labs (all labs ordered are listed, but only abnormal results are displayed) Labs Reviewed - No data to display  EKG None  Radiology No results found.  Procedures Procedures (including critical care time)  Medications Ordered in ED Medications - No data to display   Initial Impression / Assessment and Plan / ED  Course  I have reviewed the triage vital signs and the nursing notes.  Pertinent labs & imaging results that were available during my care of the patient were reviewed by me and considered in my medical decision making (see chart for details).   Pt with notable  r arm swelling and deformity.  Left hip with ttp, decreased rom, ?hip fx.  Labs and xrays ordered. Pending at change of shift. Care turned over to oncoming MD  Final Clinical Impressions(s) / ED Diagnoses  pending   Dorie Rank, MD 07/29/18 [phone]

## 2018-07-26 NOTE — ED Notes (Signed)
[facility] called for transport.  

## 2018-07-26 NOTE — ED Provider Notes (Signed)
78 year old male with a chief complaints of a fall.  This is unwitnessed and is not sure exactly when it happened.  Thought to have happened yesterday.  Patient has obvious edema to the right elbow and had a plain film obtained at the facility in which she lives that showed he had a fracture.  Sent here to the ED for evaluation.  I received the patient in signout from Dr. Tomi Bamberger the plan is for discussion with orthopedics post imaging.  CT of the head with a stable subdural hematoma.  The patient has nondisplaced supracondylar fracture as viewed by me.  Case was discussed with Dr. Inda Merlin, orthopedics recommended a posterior splint past the wrist and he will follow him up in the office next week.  SPLINT APPLICATION Date/Time: 1:61 PM Authorized by: Cecilio Asper Consent: Verbal consent obtained. Risks and benefits: risks, benefits and alternatives were discussed Consent given by: patient Splint applied by: orthopedic technician Location details: R posterior arm Splint type: posterior Supplies used: ortho glass Post-procedure: The splinted body part was neurovascularly unchanged following the procedure. Patient tolerance: Patient tolerated the procedure well with no immediate complications.      Deno Etienne, DO 07/26/18 [phone]

## 2018-07-27 LAB — ABO/RH: ABO/RH(D): O POS

## 2018-08-02 ENCOUNTER — Inpatient Hospital Stay: Payer: Medicare Other | Admitting: Orthopaedic Surgery

## 2018-08-26 ENCOUNTER — Ambulatory Visit (INDEPENDENT_AMBULATORY_CARE_PROVIDER_SITE_OTHER): Payer: Medicare Other

## 2018-08-26 ENCOUNTER — Ambulatory Visit (INDEPENDENT_AMBULATORY_CARE_PROVIDER_SITE_OTHER): Payer: Medicare Other | Admitting: Orthopaedic Surgery

## 2018-08-26 ENCOUNTER — Encounter: Payer: Self-pay | Admitting: Orthopaedic Surgery

## 2018-08-26 VITALS — BP 134/71 | HR 85 | Ht 68.0 in | Wt 158.0 lb

## 2018-08-26 DIAGNOSIS — M25521 Pain in right elbow: Secondary | ICD-10-CM

## 2018-08-26 DIAGNOSIS — S42494A Other nondisplaced fracture of lower end of right humerus, initial encounter for closed fracture: Secondary | ICD-10-CM

## 2018-08-26 DIAGNOSIS — S42401A Unspecified fracture of lower end of right humerus, initial encounter for closed fracture: Secondary | ICD-10-CM | POA: Insufficient documentation

## 2018-08-26 NOTE — Progress Notes (Signed)
Office Visit Note   Patient: Mitchell Scott           Date of Birth: 04-16-1940           MRN: [id number] Visit Date: 08/26/2018              Requested by: No referring provider defined for this encounter. PCP: System, Pcp Not In   Assessment & Plan: Visit Diagnoses:  1. Pain in right elbow     Plan: Continue sling.  Return visit 3 weeks for final AP lateral right elbow x-rays.  Fall prevention discussed.    Follow-Up Instructions: Return in about 3 weeks (around 09/16/2018).   Orders:  Orders Placed This Encounter  Procedures  . XR Elbow 2 Views Right   No orders of the defined types were placed in this encounter.     Procedures: No procedures performed   Clinical Data: No additional findings.   Subjective: Chief Complaint  Patient presents with  . Right Elbow - Fracture    HPI 78 year old male seen with right elbow injury with apparent fall on 07/21/2018.  He had a history of having a subdural I was in the hospital discharged to a skilled facility.  Unwitnessed fall.  Is placed in a posterior splint patient currently is just in a sling.  Patient states he had fallen in the bathroom.  X-rays in the hospital showed nondisplaced supracondylar humerus fracture.  Review of Systems recent admission reviewed with subdural hematoma.  Plus for diabetes, history of MI, hypertension, liver damage and current right supracondylar humerus fracture.   Objective: Vital Signs: BP 134/71   Pulse 85   Ht 5\' 8"  (1.727 m)   Wt 158 lb (71.7 kg)   BMI 24.02 kg/m   Physical Exam Constitutional:      Appearance: He is well-developed.  HENT:     Head: Normocephalic and atraumatic.  Eyes:     Pupils: Pupils are equal, round, and reactive to light.  Neck:     Thyroid: No thyromegaly.     Trachea: No tracheal deviation.  Cardiovascular:     Rate and Rhythm: Normal rate.  Pulmonary:     Effort: Pulmonary effort is normal.     Breath sounds: No wheezing.  Abdominal:   General: Bowel sounds are normal.     Palpations: Abdomen is soft.  Skin:    General: Skin is warm and dry.     Capillary Refill: Capillary refill takes less than 2 seconds.  Neurological:     Mental Status: He is alert and oriented to person, place, and time.  Psychiatric:        Behavior: Behavior normal.     Comments: Patient's memory is fair.     Ortho Exam patient has 2215 elbow range of motion.  Full extension pop is present which is painful.  No pain with supination pronation.  Specialty Comments:  No specialty comments available.  Imaging: Xr Elbow 2 Views Right  Result Date: 08/26/2018 AP and lateral right elbow x-rays are obtained and reviewed.  This shows some calcification with spurring on the coronoid.  Calcification of the triceps insertion site.  Nondisplaced transverse supracondylar humerus fracture. Impression: Nondisplaced right  supracondylar humerus fracture.    PMFS History: Patient Active Problem List   Diagnosis Date Noted  . Fall 07/03/2018  . Leukocytosis 07/03/2018  . Acute metabolic encephalopathy 88/41/6606  . CAD (coronary artery disease) 07/03/2018  . COVID-19 virus infection 07/03/2018  . SDH (subdural hematoma) ([place])   .  Hypertension   . Hypercholesteremia   . Diabetes mellitus without complication [facility])    Past Medical History:  Diagnosis Date  . Diabetes mellitus without complication ([facility])   . Heart attack ([facility])   . Hypercholesteremia   . Hypertension   . Liver damage   . SDH (subdural hematoma) ([facility])     No family history on file.  Past Surgical History:  Procedure Laterality Date  . TONSILLECTOMY     Social History   Occupational History  . Not on file  Tobacco Use  . Smoking status: Never Smoker  . Smokeless tobacco: Never Used  Substance and Sexual Activity  . Alcohol use: No  . Drug use: No  . Sexual activity: Not on file

## 2018-09-16 ENCOUNTER — Ambulatory Visit: Payer: Medicare Other | Admitting: Orthopaedic Surgery

## 2018-09-29 ENCOUNTER — Ambulatory Visit (INDEPENDENT_AMBULATORY_CARE_PROVIDER_SITE_OTHER): Payer: Medicare Other | Admitting: Surgery

## 2018-09-29 ENCOUNTER — Ambulatory Visit: Payer: Self-pay

## 2018-09-29 ENCOUNTER — Encounter: Payer: Self-pay | Admitting: Surgery

## 2018-09-29 VITALS — Ht 68.0 in | Wt 158.0 lb

## 2018-09-29 DIAGNOSIS — S42494A Other nondisplaced fracture of lower end of right humerus, initial encounter for closed fracture: Secondary | ICD-10-CM

## 2018-09-29 NOTE — Progress Notes (Signed)
78 year old white male returns for recheck of right elbow supracondylar humerus fracture.  He is about 2 months out from injury.  States that he has not been wearing his sling.  Says that he did not know that he was supposed to have this on.  Still complains of pain in the elbow.  Exam Radial nerve is intact.  He is tender over the fracture site.   Plan Advised patient that it is very important he is compliant with wearing a sling.  Discussed the problems that can occur if this does not heal which could lead to reinjury and longer healing time.  He voices understanding.  I will have him follow-up with Dr. Marlou Sa in 3 weeks for recheck in Dr. Narda Amber absence.  X-rays will be done at that visit and we will see what Dr. Marlou Sa thinks about getting patient to come out of sling to do some range of motion.

## 2018-10-20 ENCOUNTER — Ambulatory Visit: Payer: Medicare Other | Admitting: Orthopedic Surgery

## 2020-06-06 ENCOUNTER — Other Ambulatory Visit (HOSPITAL_COMMUNITY): Payer: Self-pay | Admitting: *Deleted

## 2020-06-06 DIAGNOSIS — R059 Cough, unspecified: Secondary | ICD-10-CM

## 2020-06-06 DIAGNOSIS — R1310 Dysphagia, unspecified: Secondary | ICD-10-CM

## 2020-06-20 ENCOUNTER — Ambulatory Visit (HOSPITAL_COMMUNITY)
Admission: RE | Admit: 2020-06-20 | Discharge: 2020-06-20 | Disposition: A | Discharge status: Home / Self Care | Payer: Medicare Other | Source: Ambulatory Visit | Attending: Gerontology | Admitting: Gerontology

## 2020-06-20 ENCOUNTER — Other Ambulatory Visit: Payer: Self-pay

## 2020-06-20 DIAGNOSIS — R059 Cough, unspecified: Secondary | ICD-10-CM | POA: Insufficient documentation

## 2020-06-20 DIAGNOSIS — R1310 Dysphagia, unspecified: Secondary | ICD-10-CM | POA: Diagnosis present

## 2020-08-02 NOTE — Addendum Note (Signed)
Encounter addended by: Novella Olive on: 08/02/2020 4:01 PM  Actions taken: Letter saved

## 2020-08-30 ENCOUNTER — Other Ambulatory Visit: Payer: Self-pay | Admitting: Gastroenterology

## 2020-08-30 ENCOUNTER — Other Ambulatory Visit (HOSPITAL_COMMUNITY): Payer: Self-pay | Admitting: Gastroenterology

## 2020-08-30 DIAGNOSIS — R1310 Dysphagia, unspecified: Secondary | ICD-10-CM

## 2020-09-10 ENCOUNTER — Ambulatory Visit (HOSPITAL_COMMUNITY)
Admission: RE | Admit: 2020-09-10 | Discharge: 2020-09-10 | Disposition: A | Discharge status: Home / Self Care | Payer: Medicare Other | Source: Ambulatory Visit | Attending: Gastroenterology | Admitting: Gastroenterology

## 2020-09-10 ENCOUNTER — Other Ambulatory Visit: Payer: Self-pay

## 2020-09-10 ENCOUNTER — Other Ambulatory Visit (HOSPITAL_COMMUNITY): Payer: Self-pay | Admitting: Gastroenterology

## 2020-09-10 DIAGNOSIS — R1310 Dysphagia, unspecified: Secondary | ICD-10-CM | POA: Insufficient documentation

## 2020-09-11 ENCOUNTER — Other Ambulatory Visit: Payer: Self-pay | Admitting: Gastroenterology

## 2020-09-27 ENCOUNTER — Encounter (HOSPITAL_COMMUNITY): Payer: Self-pay | Admitting: Gastroenterology

## 2020-09-27 NOTE — Progress Notes (Signed)
Attempted to obtain medical history via telephone, unable to reach at this time. I left a voicemail to return pre surgical testing department's phone call.  

## 2020-10-01 ENCOUNTER — Other Ambulatory Visit: Payer: Self-pay | Admitting: Gastroenterology

## 2020-10-07 NOTE — Progress Notes (Signed)
Patient resides at [facility] and rehab, sent fax info on 9/26 and called multiple times since then on getting information back for his endo procedure on 10/4. Spoke with patient's nurse today and told him he would have to have these papers with him tomorrow for the procedure. Endo unit notified.

## 2020-10-08 ENCOUNTER — Ambulatory Visit (HOSPITAL_COMMUNITY): Payer: Medicare Other | Admitting: Anesthesiology

## 2020-10-08 ENCOUNTER — Other Ambulatory Visit: Payer: Self-pay

## 2020-10-08 ENCOUNTER — Encounter (HOSPITAL_COMMUNITY)
Admission: RE | Disposition: A | Discharge status: Rehabilitation Facility | Payer: Self-pay | Source: Ambulatory Visit | Attending: Gastroenterology

## 2020-10-08 ENCOUNTER — Ambulatory Visit (HOSPITAL_COMMUNITY)
Admission: RE | Admit: 2020-10-08 | Discharge: 2020-10-08 | Disposition: A | Discharge status: Rehabilitation Facility | Payer: Medicare Other | Source: Ambulatory Visit | Attending: Gastroenterology | Admitting: Gastroenterology

## 2020-10-08 DIAGNOSIS — R933 Abnormal findings on diagnostic imaging of other parts of digestive tract: Secondary | ICD-10-CM | POA: Insufficient documentation

## 2020-10-08 DIAGNOSIS — R1310 Dysphagia, unspecified: Secondary | ICD-10-CM | POA: Diagnosis present

## 2020-10-08 DIAGNOSIS — K222 Esophageal obstruction: Secondary | ICD-10-CM | POA: Insufficient documentation

## 2020-10-08 DIAGNOSIS — K449 Diaphragmatic hernia without obstruction or gangrene: Secondary | ICD-10-CM | POA: Insufficient documentation

## 2020-10-08 DIAGNOSIS — K2950 Unspecified chronic gastritis without bleeding: Secondary | ICD-10-CM | POA: Insufficient documentation

## 2020-10-08 DIAGNOSIS — I2510 Atherosclerotic heart disease of native coronary artery without angina pectoris: Secondary | ICD-10-CM | POA: Insufficient documentation

## 2020-10-08 DIAGNOSIS — I10 Essential (primary) hypertension: Secondary | ICD-10-CM | POA: Insufficient documentation

## 2020-10-08 DIAGNOSIS — E119 Type 2 diabetes mellitus without complications: Secondary | ICD-10-CM | POA: Diagnosis not present

## 2020-10-08 HISTORY — PX: BIOPSY: SHX5522

## 2020-10-08 HISTORY — PX: BALLOON DILATION: SHX5330

## 2020-10-08 HISTORY — PX: ESOPHAGOGASTRODUODENOSCOPY (EGD) WITH PROPOFOL: SHX5813

## 2020-10-08 LAB — GLUCOSE, CAPILLARY: Glucose-Capillary: 159 mg/dL — ABNORMAL HIGH (ref 70–99)

## 2020-10-08 SURGERY — ESOPHAGOGASTRODUODENOSCOPY (EGD) WITH PROPOFOL
Anesthesia: Monitor Anesthesia Care

## 2020-10-08 MED ORDER — SODIUM CHLORIDE 0.9 % IV SOLN: INTRAVENOUS | Status: DC

## 2020-10-08 MED ORDER — PHENYLEPHRINE 40 MCG/ML (10ML) SYRINGE FOR IV PUSH (FOR BLOOD PRESSURE SUPPORT)
PREFILLED_SYRINGE | INTRAVENOUS | Status: DC | PRN
  Administered 2020-10-08: 80 ug via INTRAVENOUS

## 2020-10-08 MED ORDER — PROPOFOL 500 MG/50ML IV EMUL
INTRAVENOUS | Status: AC
  Filled 2020-10-08: qty 50

## 2020-10-08 MED ORDER — PROPOFOL 10 MG/ML IV BOLUS
INTRAVENOUS | Status: DC | PRN
  Administered 2020-10-08: 20 mg via INTRAVENOUS

## 2020-10-08 MED ORDER — LACTATED RINGERS IV SOLN: INTRAVENOUS | Status: DC | PRN

## 2020-10-08 MED ORDER — PROPOFOL 500 MG/50ML IV EMUL
INTRAVENOUS | Status: DC | PRN
  Administered 2020-10-08: 125 ug/kg/min via INTRAVENOUS

## 2020-10-08 SURGICAL SUPPLY — 14 items

## 2020-10-08 NOTE — Consult Note (Signed)
Date of Initial H&P: 10/01/20  History reviewed, patient examined, no change in status, stable for surgery. 

## 2020-10-08 NOTE — Interval H&P Note (Signed)
History and Physical Interval Note:  10/08/2020 9:47 AM  Mitchell Scott  has presented today for surgery, with the diagnosis of abnormal barium swallow.  The various methods of treatment have been discussed with the patient and family. After consideration of risks, benefits and other options for treatment, the patient has consented to  Procedure(s) with comments: ESOPHAGOGASTRODUODENOSCOPY (EGD) WITH PROPOFOL (N/A) - possible dilation as a surgical intervention.  The patient's history has been reviewed, patient examined, no change in status, stable for surgery.  I have reviewed the patient's chart and labs.  Questions were answered to the patient's satisfaction.     Shirley Friar

## 2020-10-08 NOTE — Op Note (Signed)
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 Estimated Blood Loss:     Estimated blood loss was minimal. Procedure:                Pre-Anesthesia Assessment:                           - Prior to the procedure, a History and Physical                            was performed, and patient medications and                            allergies were reviewed. The patient's tolerance of                            previous anesthesia was also reviewed. The risks                            and benefits of the procedure and the sedation                            options and risks were discussed with the patient.                            All questions were answered, and informed consent                            was obtained. Prior Anticoagulants: The patient has                            taken no previous anticoagulant or antiplatelet                            agents. ASA Grade Assessment: III - A patient with  severe systemic disease. After reviewing the risks                            and benefits, the patient was deemed in                            satisfactory condition to undergo the procedure.                           After obtaining informed consent, the endoscope was                            passed under direct vision. Throughout the                             procedure, the patient's blood pressure, pulse, and                            oxygen saturations were monitored continuously. The                            [id number]-[id number] ([id number]) Olympus endoscope was introduced                            through the mouth, and advanced to the second part                            of duodenum. The upper GI endoscopy was performed                            with difficulty due to stricture. Successful                            completion of the procedure was aided by performing                            the maneuvers documented (below) in this report.                            The patient tolerated the procedure well. Scope In: Scope Out: Findings:      One benign-appearing, intrinsic severe (stenosis; an endoscope cannot       pass) stenosis was found at the gastroesophageal junction. This stenosis       measured 9 mm (inner diameter). The stenosis was traversed after       dilation. A TTS dilator was passed through the scope. Dilation with a       6-7-8 mm balloon, an 08-13-08 mm balloon and a 10-16-10 mm balloon dilator       was performed to 12 mm. The dilation site was examined and showed       moderate improvement in luminal narrowing.      The Z-line was found 36 cm from the incisors.      Segmental mild inflammation characterized by congestion (edema) and       linear erosions was found  in the gastric antrum. Biopsies were taken       with a cold forceps for histology. Estimated blood loss was minimal.      A medium-sized hiatal hernia was present.      Patchy moderate mucosal changes characterized by congestion, erythema,       erosion and ulceration were found in the duodenal bulb.      The exam of the duodenum was otherwise normal. Impression:               - Benign-appearing esophageal stenosis. Dilated.                           - Z-line, 36 cm from the incisors.                           - Acute gastritis. Biopsied.                            - Medium-sized hiatal hernia.                           - Mucosal changes in the duodenum. Moderate Sedation:      N/A - MAC procedure Recommendation:           - Patient has a contact number available for                            emergencies. The signs and symptoms of potential                            delayed complications were discussed with the                            patient. Return to normal activities tomorrow.                            Written discharge instructions were provided to the                            patient.                           - Follow an antireflux regimen.                           - Await pathology results. Procedure Code(s):        --- Professional ---                           [phone], Esophagogastroduodenoscopy, flexible,                            transoral; with transendoscopic balloon dilation of                            esophagus (less than 30 mm diameter) Diagnosis Code(s):        --- Professional ---  R13.10, Dysphagia, unspecified                           K22.2, Esophageal obstruction                           K29.00, Acute gastritis without bleeding                           K44.9, Diaphragmatic hernia without obstruction or                            gangrene                           K31.89, Other diseases of stomach and duodenum                           R93.3, Abnormal findings on diagnostic imaging of                            other parts of digestive tract CPT copyright 2019 American [employer]. All rights reserved. The codes documented in this report are preliminary and upon coder review may  be revised to meet current compliance requirements. Lear Ng, MD 10/08/2020 10:27:14 AM This report has been signed electronically. Number of Addenda: 0

## 2020-10-08 NOTE — Anesthesia Preprocedure Evaluation (Addendum)
Anesthesia Evaluation  Patient identified by MRN, date of birth, ID band Patient awake    Reviewed: Allergy & Precautions, NPO status , Patient's Chart, lab work & pertinent test results  History of Anesthesia Complications Negative for: history of anesthetic complications  Airway Mallampati: I  TM Distance: >3 FB Neck ROM: Full    Dental  (+) Missing, Poor Dentition, Dental Advisory Given   Pulmonary neg pulmonary ROS,    Pulmonary exam normal        Cardiovascular hypertension, + CAD  Normal cardiovascular exam     Neuro/Psych negative neurological ROS     GI/Hepatic negative GI ROS, Neg liver ROS,   Endo/Other  diabetes  Renal/GU negative Renal ROS     Musculoskeletal negative musculoskeletal ROS (+)   Abdominal   Peds  Hematology negative hematology ROS (+)   Anesthesia Other Findings   Reproductive/Obstetrics                            Anesthesia Physical Anesthesia Plan  ASA: 3  Anesthesia Plan: MAC   Post-op Pain Management:    Induction: Intravenous  PONV Risk Score and Plan: 1 and Ondansetron  Airway Management Planned: Natural Airway  Additional Equipment:   Intra-op Plan:   Post-operative Plan:   Informed Consent: I have reviewed the patients History and Physical, chart, labs and discussed the procedure including the risks, benefits and alternatives for the proposed anesthesia with the patient or authorized representative who has indicated his/her understanding and acceptance.     Dental advisory given and Consent reviewed with POA  Plan Discussed with: Anesthesiologist  Anesthesia Plan Comments:        Anesthesia Quick Evaluation

## 2020-10-08 NOTE — Anesthesia Postprocedure Evaluation (Signed)
Anesthesia Post Note  Patient: Ostin Mathey  Procedure(s) Performed: ESOPHAGOGASTRODUODENOSCOPY (EGD) WITH PROPOFOL BALLOON DILATION BIOPSY     Patient location during evaluation: PACU Anesthesia Type: MAC Level of consciousness: awake and alert Pain management: pain level controlled Vital Signs Assessment: post-procedure vital signs reviewed and stable Respiratory status: spontaneous breathing and respiratory function stable Cardiovascular status: stable Postop Assessment: no apparent nausea or vomiting Anesthetic complications: no   No notable events documented.  Last Vitals:  Vitals:   10/08/20 1036 10/08/20 1040  BP:  (!) 108/51  Pulse: 65 63  Resp: 12 10  Temp:    SpO2:  98%    Last Pain:  Vitals:   10/08/20 1040  TempSrc:   PainSc: 0-No pain                 Rosalva Neary DANIEL

## 2020-10-08 NOTE — Transfer of Care (Signed)
Immediate Anesthesia Transfer of Care Note  Patient: Mitchell Scott  Procedure(s) Performed: ESOPHAGOGASTRODUODENOSCOPY (EGD) WITH PROPOFOL BALLOON DILATION BIOPSY  Patient Location: PACU  Anesthesia Type:MAC  Level of Consciousness: awake  Airway & Oxygen Therapy: Patient Spontanous Breathing and Patient connected to face mask oxygen  Post-op Assessment: Report given to RN, Post -op Vital signs reviewed and stable and Patient moving all extremities X 4  Post vital signs: Reviewed and stable  Last Vitals:  Vitals Value Taken Time  BP 109/60   Temp    Pulse 74   Resp 16   SpO2 100     Last Pain:  Vitals:   10/08/20 0903  TempSrc: Temporal  PainSc: 8          Complications: No notable events documented.

## 2020-10-08 NOTE — H&P (View-Only) (Signed)
Date of Initial H&P: 10/01/20  History reviewed, patient examined, no change in status, stable for surgery. 

## 2020-10-08 NOTE — Anesthesia Procedure Notes (Signed)
Procedure Name: MAC Date/Time: 10/08/2020 9:47 AM Performed by: Niel Hummer, CRNA Pre-anesthesia Checklist: Patient identified, Emergency Drugs available, Suction available and Patient being monitored Oxygen Delivery Method: Simple face mask

## 2020-10-08 NOTE — Discharge Instructions (Signed)
YOU HAD AN ENDOSCOPIC PROCEDURE TODAY: Refer to the procedure report and other information in the discharge instructions given to you for any specific questions about what was found during the examination. If this information does not answer your questions, please call [facility] office at [phone] to clarify.   YOU SHOULD EXPECT: Some feelings of bloating in the abdomen. Passage of more gas than usual. Walking can help get rid of the air that was put into your GI tract during the procedure and reduce the bloating. If you had a lower endoscopy (such as a colonoscopy or flexible sigmoidoscopy) you may notice spotting of blood in your stool or on the toilet paper. Some abdominal soreness may be present for a day or two, also.  DIET: Your first meal following the procedure should be a light meal and then it is ok to progress to your normal diet. A half-sandwich or bowl of soup is an example of a good first meal. Heavy or fried foods are harder to digest and may make you feel nauseous or bloated. Drink plenty of fluids but you should avoid alcoholic beverages for 24 hours. If you had a esophageal dilation, please see attached instructions for diet.    ACTIVITY: Your care partner should take you home directly after the procedure. You should plan to take it easy, moving slowly for the rest of the day. You can resume normal activity the day after the procedure however YOU SHOULD NOT DRIVE, use power tools, machinery or perform tasks that involve climbing or major physical exertion for 24 hours (because of the sedation medicines used during the test).   SYMPTOMS TO REPORT IMMEDIATELY: A gastroenterologist can be reached at any hour. Please call [phone]  for any of the following symptoms:  Following upper endoscopy (EGD, EUS, ERCP, esophageal dilation) Vomiting of blood or coffee ground material  New, significant abdominal pain  New, significant chest pain or pain under the shoulder blades  Painful or  persistently difficult swallowing  New shortness of breath  Black, tarry-looking or red, bloody stools  FOLLOW UP:  If any biopsies were taken you will be contacted by phone or by letter within the next 1-3 weeks. Call [phone]  if you have not heard about the biopsies in 3 weeks.  Please also call with any specific questions about appointments or follow up tests. YOU HAD AN ENDOSCOPIC PROCEDURE TODAY: Refer to the procedure report and other information in the discharge instructions given to you for any specific questions about what was found during the examination. If this information does not answer your questions, please call [facility] office at [phone] to clarify.  Following upper endoscopy (EGD, EUS, ERCP, esophageal dilation) Vomiting of blood or coffee ground material  New, significant abdominal pain  New, significant chest pain or pain under the shoulder blades  Painful or persistently difficult swallowing  New shortness of breath  Black, tarry-looking or red, bloody stools  FOLLOW UP:  If any biopsies were taken you will be contacted by phone or by letter within the next 1-3 weeks. Call [phone]  if you have not heard about the biopsies in 3 weeks.  Please also call with any specific questions about appointments or follow up tests.

## 2020-10-09 ENCOUNTER — Encounter (HOSPITAL_COMMUNITY): Payer: Self-pay | Admitting: Gastroenterology

## 2020-10-12 LAB — SURGICAL PATHOLOGY

## 2021-05-13 ENCOUNTER — Other Ambulatory Visit: Payer: Self-pay

## 2021-05-13 ENCOUNTER — Encounter (HOSPITAL_COMMUNITY): Payer: Self-pay | Admitting: Internal Medicine

## 2021-05-13 ENCOUNTER — Emergency Department (HOSPITAL_COMMUNITY): Payer: Medicare Other

## 2021-05-13 ENCOUNTER — Inpatient Hospital Stay (HOSPITAL_COMMUNITY)
Admission: EM | Admit: 2021-05-13 | Discharge: 2021-05-18 | DRG: 871 | Disposition: A | Discharge status: Skilled Nursing Facility | Payer: Medicare Other | Attending: Internal Medicine | Admitting: Internal Medicine

## 2021-05-13 DIAGNOSIS — N179 Acute kidney failure, unspecified: Secondary | ICD-10-CM | POA: Diagnosis present

## 2021-05-13 DIAGNOSIS — I10 Essential (primary) hypertension: Secondary | ICD-10-CM | POA: Diagnosis present

## 2021-05-13 DIAGNOSIS — E1165 Type 2 diabetes mellitus with hyperglycemia: Secondary | ICD-10-CM | POA: Diagnosis present

## 2021-05-13 DIAGNOSIS — L24A2 Irritant contact dermatitis due to fecal, urinary or dual incontinence: Secondary | ICD-10-CM | POA: Diagnosis not present

## 2021-05-13 DIAGNOSIS — R339 Retention of urine, unspecified: Secondary | ICD-10-CM | POA: Diagnosis present

## 2021-05-13 DIAGNOSIS — E870 Hyperosmolality and hypernatremia: Secondary | ICD-10-CM | POA: Diagnosis present

## 2021-05-13 DIAGNOSIS — K224 Dyskinesia of esophagus: Secondary | ICD-10-CM | POA: Diagnosis present

## 2021-05-13 DIAGNOSIS — I959 Hypotension, unspecified: Secondary | ICD-10-CM | POA: Diagnosis present

## 2021-05-13 DIAGNOSIS — N400 Enlarged prostate without lower urinary tract symptoms: Secondary | ICD-10-CM | POA: Diagnosis present

## 2021-05-13 DIAGNOSIS — R4182 Altered mental status, unspecified: Principal | ICD-10-CM

## 2021-05-13 DIAGNOSIS — E873 Alkalosis: Secondary | ICD-10-CM | POA: Diagnosis present

## 2021-05-13 DIAGNOSIS — A419 Sepsis, unspecified organism: Secondary | ICD-10-CM | POA: Diagnosis not present

## 2021-05-13 DIAGNOSIS — K449 Diaphragmatic hernia without obstruction or gangrene: Secondary | ICD-10-CM | POA: Diagnosis present

## 2021-05-13 DIAGNOSIS — R627 Adult failure to thrive: Secondary | ICD-10-CM | POA: Diagnosis present

## 2021-05-13 DIAGNOSIS — L89152 Pressure ulcer of sacral region, stage 2: Secondary | ICD-10-CM | POA: Diagnosis present

## 2021-05-13 DIAGNOSIS — B351 Tinea unguium: Secondary | ICD-10-CM | POA: Insufficient documentation

## 2021-05-13 DIAGNOSIS — I252 Old myocardial infarction: Secondary | ICD-10-CM

## 2021-05-13 DIAGNOSIS — R7881 Bacteremia: Principal | ICD-10-CM | POA: Diagnosis present

## 2021-05-13 DIAGNOSIS — K5900 Constipation, unspecified: Secondary | ICD-10-CM | POA: Diagnosis present

## 2021-05-13 DIAGNOSIS — I700 Atherosclerosis of aorta: Secondary | ICD-10-CM | POA: Diagnosis present

## 2021-05-13 DIAGNOSIS — Z66 Do not resuscitate: Secondary | ICD-10-CM | POA: Diagnosis present

## 2021-05-13 DIAGNOSIS — Z20822 Contact with and (suspected) exposure to covid-19: Secondary | ICD-10-CM | POA: Diagnosis present

## 2021-05-13 DIAGNOSIS — K222 Esophageal obstruction: Secondary | ICD-10-CM | POA: Diagnosis present

## 2021-05-13 DIAGNOSIS — E871 Hypo-osmolality and hyponatremia: Secondary | ICD-10-CM | POA: Diagnosis present

## 2021-05-13 DIAGNOSIS — Z79899 Other long term (current) drug therapy: Secondary | ICD-10-CM

## 2021-05-13 DIAGNOSIS — Z993 Dependence on wheelchair: Secondary | ICD-10-CM

## 2021-05-13 DIAGNOSIS — Z880 Allergy status to penicillin: Secondary | ICD-10-CM

## 2021-05-13 DIAGNOSIS — G9341 Metabolic encephalopathy: Secondary | ICD-10-CM | POA: Diagnosis present

## 2021-05-13 DIAGNOSIS — E1142 Type 2 diabetes mellitus with diabetic polyneuropathy: Secondary | ICD-10-CM | POA: Diagnosis present

## 2021-05-13 DIAGNOSIS — Z885 Allergy status to narcotic agent status: Secondary | ICD-10-CM

## 2021-05-13 DIAGNOSIS — E7800 Pure hypercholesterolemia, unspecified: Secondary | ICD-10-CM | POA: Diagnosis present

## 2021-05-13 DIAGNOSIS — B9620 Unspecified Escherichia coli [E. coli] as the cause of diseases classified elsewhere: Secondary | ICD-10-CM | POA: Diagnosis present

## 2021-05-13 DIAGNOSIS — Z794 Long term (current) use of insulin: Secondary | ICD-10-CM

## 2021-05-13 DIAGNOSIS — Z1623 Resistance to quinolones and fluoroquinolones: Secondary | ICD-10-CM | POA: Diagnosis present

## 2021-05-13 DIAGNOSIS — E878 Other disorders of electrolyte and fluid balance, not elsewhere classified: Secondary | ICD-10-CM | POA: Diagnosis present

## 2021-05-13 DIAGNOSIS — E876 Hypokalemia: Secondary | ICD-10-CM | POA: Diagnosis present

## 2021-05-13 DIAGNOSIS — Z7984 Long term (current) use of oral hypoglycemic drugs: Secondary | ICD-10-CM

## 2021-05-13 DIAGNOSIS — I2510 Atherosclerotic heart disease of native coronary artery without angina pectoris: Secondary | ICD-10-CM | POA: Diagnosis present

## 2021-05-13 DIAGNOSIS — L8990 Pressure ulcer of unspecified site, unspecified stage: Secondary | ICD-10-CM | POA: Insufficient documentation

## 2021-05-13 LAB — CBC WITH DIFFERENTIAL/PLATELET
Abs Immature Granulocytes: 0.19 10*3/uL — ABNORMAL HIGH (ref 0.00–0.07)
Basophils Absolute: 0 10*3/uL (ref 0.0–0.1)
Basophils Relative: 0 %
Eosinophils Absolute: 0 10*3/uL (ref 0.0–0.5)
Eosinophils Relative: 0 %
HCT: 46.4 % (ref 39.0–52.0)
Hemoglobin: 15.6 g/dL (ref 13.0–17.0)
Immature Granulocytes: 1 %
Lymphocytes Relative: 4 %
Lymphs Abs: 0.8 10*3/uL (ref 0.7–4.0)
MCH: 29.2 pg (ref 26.0–34.0)
MCHC: 33.6 g/dL (ref 30.0–36.0)
MCV: 86.7 fL (ref 80.0–100.0)
Monocytes Absolute: 2.3 10*3/uL — ABNORMAL HIGH (ref 0.1–1.0)
Monocytes Relative: 11 %
Neutro Abs: 18.8 10*3/uL — ABNORMAL HIGH (ref 1.7–7.7)
Neutrophils Relative %: 84 %
Platelets: 430 10*3/uL — ABNORMAL HIGH (ref 150–400)
RBC: 5.35 MIL/uL (ref 4.22–5.81)
RDW: 15.7 % — ABNORMAL HIGH (ref 11.5–15.5)
WBC: 22.2 10*3/uL — ABNORMAL HIGH (ref 4.0–10.5)
nRBC: 0 % (ref 0.0–0.2)

## 2021-05-13 LAB — COMPREHENSIVE METABOLIC PANEL
ALT: 10 U/L (ref 0–44)
AST: 11 U/L — ABNORMAL LOW (ref 15–41)
Albumin: 3.1 g/dL — ABNORMAL LOW (ref 3.5–5.0)
Alkaline Phosphatase: 101 U/L (ref 38–126)
Anion gap: 17 — ABNORMAL HIGH (ref 5–15)
BUN: 94 mg/dL — ABNORMAL HIGH (ref 8–23)
CO2: 22 mmol/L (ref 22–32)
Calcium: 10.4 mg/dL — ABNORMAL HIGH (ref 8.9–10.3)
Chloride: 114 mmol/L — ABNORMAL HIGH (ref 98–111)
Creatinine, Ser: 2.95 mg/dL — ABNORMAL HIGH (ref 0.61–1.24)
GFR, Estimated: 21 mL/min — ABNORMAL LOW (ref >60)
Glucose, Bld: 415 mg/dL — ABNORMAL HIGH (ref 70–99)
Potassium: 4.4 mmol/L (ref 3.5–5.1)
Sodium: 153 mmol/L — ABNORMAL HIGH (ref 135–145)
Total Bilirubin: 0.8 mg/dL (ref 0.3–1.2)
Total Protein: 7.4 g/dL (ref 6.5–8.1)

## 2021-05-13 LAB — CBG MONITORING, ED
Glucose-Capillary: 355 mg/dL — ABNORMAL HIGH (ref 70–99)
Glucose-Capillary: 439 mg/dL — ABNORMAL HIGH (ref 70–99)

## 2021-05-13 LAB — RESP PANEL BY RT-PCR (FLU A&B, COVID) ARPGX2
Influenza A by PCR: NEGATIVE
Influenza B by PCR: NEGATIVE
SARS Coronavirus 2 by RT PCR: NEGATIVE

## 2021-05-13 LAB — APTT: aPTT: 23 seconds — ABNORMAL LOW (ref 24–36)

## 2021-05-13 LAB — BLOOD GAS, VENOUS
Acid-Base Excess: 1.4 mmol/L (ref 0.0–2.0)
Bicarbonate: 24.9 mmol/L (ref 20.0–28.0)
O2 Saturation: 88.7 %
Patient temperature: 37
pCO2, Ven: 35 mmHg — ABNORMAL LOW (ref 44–60)
pH, Ven: 7.46 — ABNORMAL HIGH (ref 7.25–7.43)
pO2, Ven: 56 mmHg — ABNORMAL HIGH (ref 32–45)

## 2021-05-13 LAB — GLUCOSE, CAPILLARY: Glucose-Capillary: 392 mg/dL — ABNORMAL HIGH (ref 70–99)

## 2021-05-13 LAB — MRSA NEXT GEN BY PCR, NASAL: MRSA by PCR Next Gen: NOT DETECTED

## 2021-05-13 LAB — PROTIME-INR
INR: 1.7 — ABNORMAL HIGH (ref 0.8–1.2)
Prothrombin Time: 19.9 seconds — ABNORMAL HIGH (ref 11.4–15.2)

## 2021-05-13 LAB — LACTIC ACID, PLASMA
Lactic Acid, Venous: 2.1 mmol/L (ref 0.5–1.9)
Lactic Acid, Venous: 1.6 mmol/L (ref 0.5–1.9)

## 2021-05-13 MED ORDER — ONDANSETRON HCL 4 MG/2ML IJ SOLN: 4.0000 mg | Freq: Four times a day (QID) | INTRAMUSCULAR | Status: DC | PRN

## 2021-05-13 MED ORDER — VANCOMYCIN HCL 750 MG/150ML IV SOLN: 750.0000 mg | INTRAVENOUS | Status: DC

## 2021-05-13 MED ORDER — ONDANSETRON HCL 4 MG PO TABS: 4.0000 mg | ORAL_TABLET | Freq: Four times a day (QID) | ORAL | Status: DC | PRN

## 2021-05-13 MED ORDER — SODIUM CHLORIDE 0.9 % IV SOLN
500.0000 mg | Freq: Once | INTRAVENOUS | Status: AC
  Administered 2021-05-13: 500 mg via INTRAVENOUS
  Filled 2021-05-13: qty 5

## 2021-05-13 MED ORDER — METRONIDAZOLE 500 MG/100ML IV SOLN
500.0000 mg | Freq: Two times a day (BID) | INTRAVENOUS | Status: DC
  Administered 2021-05-13 – 2021-05-14 (×2): 500 mg via INTRAVENOUS
  Filled 2021-05-13 (×2): qty 100

## 2021-05-13 MED ORDER — TAMSULOSIN HCL 0.4 MG PO CAPS
0.4000 mg | ORAL_CAPSULE | Freq: Every evening | ORAL | Status: DC
  Administered 2021-05-14 – 2021-05-16 (×3): 0.4 mg via ORAL
  Filled 2021-05-13 (×3): qty 1

## 2021-05-13 MED ORDER — ACETAMINOPHEN 325 MG PO TABS
650.0000 mg | ORAL_TABLET | Freq: Four times a day (QID) | ORAL | Status: DC | PRN
  Administered 2021-05-17: 650 mg via ORAL
  Filled 2021-05-13: qty 2

## 2021-05-13 MED ORDER — VANCOMYCIN HCL IN DEXTROSE 1-5 GM/200ML-% IV SOLN
1000.0000 mg | Freq: Once | INTRAVENOUS | Status: AC
  Administered 2021-05-13: 1000 mg via INTRAVENOUS
  Filled 2021-05-13: qty 200

## 2021-05-13 MED ORDER — SODIUM CHLORIDE 0.45 % IV SOLN: INTRAVENOUS | Status: DC

## 2021-05-13 MED ORDER — ENOXAPARIN SODIUM 30 MG/0.3ML IJ SOSY
30.0000 mg | PREFILLED_SYRINGE | INTRAMUSCULAR | Status: DC
  Administered 2021-05-13 – 2021-05-14 (×2): 30 mg via SUBCUTANEOUS
  Filled 2021-05-13 (×2): qty 0.3

## 2021-05-13 MED ORDER — LACTATED RINGERS IV BOLUS (SEPSIS)
250.0000 mL | Freq: Once | INTRAVENOUS | Status: AC
  Administered 2021-05-13: 250 mL via INTRAVENOUS

## 2021-05-13 MED ORDER — ACETAMINOPHEN 650 MG RE SUPP: 650.0000 mg | Freq: Four times a day (QID) | RECTAL | Status: DC | PRN

## 2021-05-13 MED ORDER — ATORVASTATIN CALCIUM 10 MG PO TABS
10.0000 mg | ORAL_TABLET | Freq: Every day | ORAL | Status: DC
  Administered 2021-05-14 – 2021-05-18 (×5): 10 mg via ORAL
  Filled 2021-05-13 (×5): qty 1

## 2021-05-13 MED ORDER — LACTATED RINGERS IV SOLN: INTRAVENOUS | Status: DC

## 2021-05-13 MED ORDER — SODIUM CHLORIDE 0.9 % IV SOLN: 2.0000 g | INTRAVENOUS | Status: DC

## 2021-05-13 MED ORDER — VITAMIN B-12 1000 MCG PO TABS
500.0000 ug | ORAL_TABLET | Freq: Every day | ORAL | Status: DC
  Administered 2021-05-14 – 2021-05-18 (×5): 500 ug via ORAL
  Filled 2021-05-13 (×6): qty 1

## 2021-05-13 MED ORDER — INSULIN ASPART 100 UNIT/ML IJ SOLN
0.0000 [IU] | Freq: Three times a day (TID) | INTRAMUSCULAR | Status: DC
  Administered 2021-05-13: 9 [IU] via SUBCUTANEOUS
  Administered 2021-05-14 (×3): 5 [IU] via SUBCUTANEOUS
  Administered 2021-05-15: 3 [IU] via SUBCUTANEOUS
  Administered 2021-05-15 – 2021-05-16 (×4): 2 [IU] via SUBCUTANEOUS
  Administered 2021-05-17 (×3): 3 [IU] via SUBCUTANEOUS
  Administered 2021-05-18: 2 [IU] via SUBCUTANEOUS
  Administered 2021-05-18: 3 [IU] via SUBCUTANEOUS
  Administered 2021-05-18: 2 [IU] via SUBCUTANEOUS
  Filled 2021-05-13: qty 0.09

## 2021-05-13 MED ORDER — LACTATED RINGERS IV BOLUS (SEPSIS)
1000.0000 mL | Freq: Once | INTRAVENOUS | Status: AC
  Administered 2021-05-13: 1000 mL via INTRAVENOUS

## 2021-05-13 MED ORDER — SODIUM CHLORIDE 0.9 % IV SOLN
2.0000 g | Freq: Once | INTRAVENOUS | Status: AC
  Administered 2021-05-13: 2 g via INTRAVENOUS
  Filled 2021-05-13: qty 12.5

## 2021-05-13 NOTE — ED Provider Notes (Signed)
?[facility] DEPT ?Provider Note ? ? ?CSN: [id number] ?Arrival date & time: 05/13/21  1012 ? ?  ? ?History ? ?Chief Complaint  ?Patient presents with  ? Altered Mental Status  ? ? ?Mitchell Scott is a 81 y.o. male. ? ? ?Altered Mental Status ? ?81 year old male with a history of DM 2, CAD/MI, HTN, HLD, previous SDH, dysphagia, arriving to the emergency department from his facility due to altered mental status.  The patient reportedly was febrile and tachypneic over the weekend, last normal 3 days ago.  His blood sugar was in the 450s at his facility and staff had been noticing increasing somnolence.  The history is primarily provided by EMS.  The patient was transported to the [facility] emergency department where he arrived GCS 8 (2-1-5), altered, somnolent, responding to painful stimuli.  He did arrive with DNR paperwork bedside. No family members were present to provide additional history. ? ?Home Medications ?Prior to Admission medications   ?Medication Sig Start Date End Date Taking? Authorizing Provider  ?atorvastatin (LIPITOR) 20 MG tablet Take 10 mg by mouth daily. 05/23/18  Yes [provider]  ?calcium carbonate (OSCAL) 1500 (600 Ca) MG TABS tablet Take 1 tablet by mouth daily with breakfast.   Yes [provider]  ?insulin lispro (HUMALOG) 100 UNIT/ML injection Inject 6 Units into the skin once.   Yes [provider]  ?metFORMIN (GLUCOPHAGE) 500 MG tablet Take 500 mg by mouth 2 (two) times daily with a meal.   Yes [provider]  ?tamsulosin (FLOMAX) 0.4 MG CAPS capsule Take 1 capsule (0.4 mg total) by mouth daily. ?Patient taking differently: Take 0.4 mg by mouth every evening. 07/08/18  Yes Charlynne Cousins, MD  ?vitamin B-12 (CYANOCOBALAMIN) 500 MCG tablet Take 500 mcg by mouth daily.   Yes [provider]  ?Blood Glucose Monitoring Suppl (ONE TOUCH ULTRA MINI) W/DEVICE KIT Frequency:   Dosage:NULL   KIT  Instructions:Use to  check BS daily.  Note:Dx:250.00   Insulin Treated 02/01/12   [provider]  ?glucose blood (FREESTYLE TEST STRIPS) test strip Use as instructed 02/20/14   Ernestina Patches, MD  ?glucose blood test strip Check BS daily. Dx: E11.9 03/07/14   [provider]  ?INS SYRINGE/NEEDLE 1CC/28G 28G X 1/2" 1 ML MISC 31 gage  To use with insulin injections daily. 03/27/13   [provider]  ?Insulin Syringe-Needle U-100 31G X 5/16" 1 ML MISC Use BID for Novolin injections and PRN SS  Novolog 10/16/13   [provider]  ?NOVOFINE PLUS 32G X 4 MM MISC See admin instructions. 03/21/14   [provider]  ?ReliOn Ultra Thin Lancets MISC Use to check BS three times daily. 03/03/10   [provider]  ?   ? ?Allergies    ?Codeine and Penicillins   ? ?Review of Systems   ?Review of Systems  ?Unable to perform ROS: Mental status change  ? ?Physical Exam ?Updated Vital Signs ?BP 110/77   Pulse 96   Temp 98.8 ?F (37.1 ?C) (Rectal)   Resp 18   Ht 5' 8" (1.727 m)   Wt 59 kg   SpO2 97%   BMI 19.77 kg/m?  ?Physical Exam ?Vitals and nursing note reviewed.  ?Constitutional:   ?   General: He is not in acute distress. ?   Appearance: He is ill-appearing.  ?   Comments: GCS 8 (2-2-4), ill appearing  ?HENT:  ?   Head: Normocephalic and atraumatic.  ?  Eyes:  ?   Conjunctiva/sclera: Conjunctivae normal.  ?   Pupils: Pupils are equal, round, and reactive to light.  ?Cardiovascular:  ?   Rate and Rhythm: Regular rhythm. Tachycardia present.  ?Pulmonary:  ?   Effort: Pulmonary effort is normal. No respiratory distress.  ?   Comments: Breath sounds diminished bilaterally ?Abdominal:  ?   General: There is no distension.  ?   Tenderness: There is no abdominal tenderness. There is no guarding.  ?Musculoskeletal:     ?   General: No deformity or signs of injury.  ?   Cervical back: Neck supple.  ?Skin: ?   Findings: No lesion or rash.  ?Neurological:  ?   General: No focal deficit present.  ?   Mental  Status: He is disoriented.  ?   Comments: No clear CN deficit, moving all four extremities in response to painful stimuli  ? ? ?ED Results / Procedures / Treatments   ?Labs ?(all labs ordered are listed, but only abnormal results are displayed) ?Labs Reviewed  ?LACTIC ACID, PLASMA - Abnormal; Notable for the following components:  ?    Result Value  ? Lactic Acid, Venous 2.1 (*)   ? All other components within normal limits  ?COMPREHENSIVE METABOLIC PANEL - Abnormal; Notable for the following components:  ? Sodium 153 (*)   ? Chloride 114 (*)   ? Glucose, Bld 415 (*)   ? BUN 94 (*)   ? Creatinine, Ser 2.95 (*)   ? Calcium 10.4 (*)   ? Albumin 3.1 (*)   ? AST 11 (*)   ? GFR, Estimated 21 (*)   ? Anion gap 17 (*)   ? All other components within normal limits  ?CBC WITH DIFFERENTIAL/PLATELET - Abnormal; Notable for the following components:  ? WBC 22.2 (*)   ? RDW 15.7 (*)   ? Platelets 430 (*)   ? Neutro Abs 18.8 (*)   ? Monocytes Absolute 2.3 (*)   ? Abs Immature Granulocytes 0.19 (*)   ? All other components within normal limits  ?PROTIME-INR - Abnormal; Notable for the following components:  ? Prothrombin Time 19.9 (*)   ? INR 1.7 (*)   ? All other components within normal limits  ?APTT - Abnormal; Notable for the following components:  ? aPTT 23 (*)   ? All other components within normal limits  ?BLOOD GAS, VENOUS - Abnormal; Notable for the following components:  ? pH, Ven 7.46 (*)   ? pCO2, Ven 35 (*)   ? pO2, Ven 56 (*)   ? All other components within normal limits  ?CBG MONITORING, ED - Abnormal; Notable for the following components:  ? Glucose-Capillary 355 (*)   ? All other components within normal limits  ?CBG MONITORING, ED - Abnormal; Notable for the following components:  ? Glucose-Capillary 439 (*)   ? All other components within normal limits  ?RESP PANEL BY RT-PCR (FLU A&B, COVID) [id number]  ?CULTURE, BLOOD (ROUTINE X 2)  ?CULTURE, BLOOD (ROUTINE X 2)  ?URINE CULTURE  ?LACTIC ACID, PLASMA  ?URINALYSIS,  ROUTINE W REFLEX MICROSCOPIC  ?HEMOGLOBIN A1C  ?CBC WITH DIFFERENTIAL/PLATELET  ?COMPREHENSIVE METABOLIC PANEL  ? ? ?EKG ?EKG Interpretation ? ?Date/Time:  Tuesday May 13 2021 10:39:11 EDT ?Ventricular Rate:  93 ?PR Interval:  141 ?QRS Duration: 129 ?QT Interval:  371 ?QTC Calculation: 462 ?R Axis:   53 ?Text Interpretation: Sinus rhythm Right bundle branch block Confirmed by Regan Lemming ([id number]) on 05/13/2021 11:56:59 AM ? ?  Radiology ?CT HEAD WO CONTRAST (5MM) ? ?Result Date: 05/13/2021 ?CLINICAL DATA:  Altered mental status.  Sepsis. EXAM: CT HEAD WITHOUT CONTRAST TECHNIQUE: Contiguous axial images were obtained from the base of the skull through the vertex without intravenous contrast. RADIATION DOSE REDUCTION: This exam was performed according to the departmental dose-optimization program which includes automated exposure control, adjustment of the mA and/or kV according to patient size and/or use of iterative reconstruction technique. COMPARISON:  CT head without contrast 07/26/2018 FINDINGS: Brain: Previously noted extra-axial collection has resolved. No acute hemorrhage, mass, or infarct is present. Moderate atrophy and white matter disease is again noted with some progression. 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 Basal ganglia are within normal limits. Insular cortex is normal. The ventricles are proportionate to the degree of atrophy. No significant extraaxial fluid collection is present. Insert normal brainstem Vascular: Atherosclerotic calcifications are present within the cavernous internal carotid arteries bilaterally. No hyperdense vessel present. Skull: Calvarium is intact. No focal lytic or blastic lesions are present. No significant extracranial soft tissue lesion is present. Sinuses/Orbits: The paranasal sinuses and mastoid air cells are clear. The globes and orbits are within normal limits. IMPRESSION: 1. No acute intracranial abnormality or significant  interval change. 2. Interval resolution of right extra-axial collection. 3. Progressive atrophy and white matter disease. This likely reflects the sequela of chronic microvascular ischemia. Electronically Signed   By:

## 2021-05-13 NOTE — Progress Notes (Signed)
Pharmacy Antibiotic Note ? ?Mitchell Scott is a 81 y.o. male admitted on 05/13/2021 with sepsis.  Pharmacy has been consulted for vancomycin & cefepime dosing. ? ?Plan: ?Vancomycin 1 gm x 1 already given followed by vancomycin 750 mg IV q48 for est AUC 478 using SCr 2.95, TBW ( TBW> IBW) & Vd 0.72 ?Cefepime 2 gm IV q24 ?Flagyl 500 mg IV q12 per MD ?F/u renal function, WBC, temp, culture data ?Vancomycin levels as needed ? ?Height: 5\' 8"  (172.7 cm) ?Weight: 59 kg (130 lb) ?IBW/kg (Calculated) : 68.4 ? ?Temp (24hrs), Avg:98.8 ?F (37.1 ?C), Min:98.8 ?F (37.1 ?C), Max:98.8 ?F (37.1 ?C) ? ?Recent Labs  ?Lab 05/13/21 ?1022 05/13/21 ?1427  ?WBC 22.2*  --   ?CREATININE 2.95*  --   ?LATICACIDVEN 2.1* 1.6  ?  ?Estimated Creatinine Clearance: 16.9 mL/min (A) (by C-G formula based on SCr of 2.95 mg/dL (H)).   ? ?Allergies  ?Allergen Reactions  ? Codeine   ?  Other reaction(s): GASTRIC UPSET  ? Penicillins Itching and Rash  ?  Tolerates pip/tazo  ?Antimicrobials this admission:  ?5/9 Vanc>> ?5/9 cefepime>> ?5/9 azith x 1 ?5/9 flagyl >> ?Dose adjustments this admission:  ? ?Microbiology results:  ?5/9 BCx2: sent ? ? ?Thank you for allowing pharmacy to be a part of this patient?s care. ? ?Eudelia Bunch, Pharm.D ?05/13/2021 3:11 PM ? ?

## 2021-05-13 NOTE — ED Triage Notes (Signed)
Ems brings pt in for altered mental status. Staff states he was last normal 3 days ago. Staff states his blood sugar has been high and he has not been moving around like he normally does. ?

## 2021-05-13 NOTE — Progress Notes (Signed)
A consult was received from an ED physician for vancomycin and cefepime per pharmacy dosing.  The patient's profile has been reviewed for ht/wt/allergies/indication/available labs.   ? ?Ht/wt order entered. No weight recorded for weight based loading dose. ? ?A one time order has been placed for vancomycin 1000 mg + cefepime 2 g IV once.   ? ?Further antibiotics/pharmacy consults should be ordered by admitting physician if indicated.       ?                ?Thank you, ?Cindi Carbon, PharmD ?05/13/2021  10:40 AM ? ?

## 2021-05-13 NOTE — Sepsis Progress Note (Signed)
Code sepsis protocol being monitored by eLink. 

## 2021-05-13 NOTE — H&P (Signed)
?History and Physical  ? ? ?Patient: Mitchell Scott XUX:[id number] DOB: 09-23-1940 ?DOA: 05/13/2021 ?DOS: the patient was seen and examined on 05/13/2021 ?PCP: Raymondo Band, MD  ?Patient coming from: SNF ? ?Chief Complaint:  ?Chief Complaint  ?Patient presents with  ? Altered Mental Status  ? ?HPI: Mitchell Scott is a 81 y.o. male with medical history significant of type 2 diabetes, diabetic polyneuropathy, CAD, history MI, hyperlipidemia, hypertension, liver damage, subdural hematoma, COVID-19 who is sent from his facility due to altered mental status.  At the last time he was seen at baseline at the facility was 3 days ago.  The facility nursing staff stated that he has been hyperglycemic in the 450s and his activity level has decreased substantially.  His initial GCS was 8 in the emergency department.  He is now waking up but unable to provide any meaningful HPI. ? ?ED course: Initial vital signs were temperature 98.8 ?F, pulse 94, respiration 19, BP 121/74 mmHg O2 sat 100% on room air.  The patient received 1000 mL of LR bolus, vancomycin, cefepime and azithromycin in the emergency department. ? ?Lab work: CBC showed a white count 22.2 with 84% neutrophils, hemoglobin 15.6 g/dL and platelets 430.  PT was 19.9, INR 1.7 and PTT 23.  Lactic acid is 2.1 and then 1.6 mmol/L.  CMP with a sodium 153, potassium 4.4, chloride 114 and CO2 22 mmol/L.  Anion gap is 17.  Calcium 10.4, glucose 415, BUN 94 and creatinine 2.95 mg/dL.  LFTs were normal except for an albumin of 3.1 g/dL and mildly decreased AST at 11 units/L.  Venous blood gas showed a pH of 7.46, PCO2 of 35 and PO2 of 56 mmHg. ? ?Imaging: Portable chest radiograph with no active disease.  CT chest/abdomen/pelvis without contrast showed large stool burden in the rectum without evidence of stercoral colitis.  4 mm urolith in the right renal collecting system with no hydronephrosis, moderate size hiatal hernia, prostatomegaly, aortic and coronary artery  atherosclerosis.  Please see images and full radiology report for further details. ?  ?Review of Systems: As mentioned in the history of present illness. All other systems reviewed and are negative. ?Past Medical History:  ?Diagnosis Date  ? Diabetes mellitus without complication ([place])   ? Heart attack ([place])   ? Hypercholesteremia   ? Hypertension   ? Liver damage   ? SDH (subdural hematoma)   ? ?Past Surgical History:  ?Procedure Laterality Date  ? BALLOON DILATION N/A 10/08/2020  ? Procedure: BALLOON DILATION;  Surgeon: Wilford Corner, MD;  Location: [facility] ENDOSCOPY;  Service: Endoscopy;  Laterality: N/A;  ? BIOPSY  10/08/2020  ? Procedure: BIOPSY;  Surgeon: Wilford Corner, MD;  Location: [facility] ENDOSCOPY;  Service: Endoscopy;;  ? ESOPHAGOGASTRODUODENOSCOPY (EGD) WITH PROPOFOL N/A 10/08/2020  ? Procedure: ESOPHAGOGASTRODUODENOSCOPY (EGD) WITH PROPOFOL;  Surgeon: Wilford Corner, MD;  Location: [facility] ENDOSCOPY;  Service: Endoscopy;  Laterality: N/A;  possible dilation  ? TONSILLECTOMY    ? ?Social History:  reports that he has never smoked. He has never used smokeless tobacco. He reports that he does not drink alcohol and does not use drugs. ? ?Allergies  ?Allergen Reactions  ? Codeine   ?  Other reaction(s): GASTRIC UPSET  ? Penicillins Itching and Rash  ?  Tolerates pip/tazo  ? ? ?No family history on file. ? ?Prior to Admission medications   ?Medication Sig Start Date End Date Taking? Authorizing Provider  ?atorvastatin (LIPITOR) 20 MG tablet Take 10 mg by mouth daily. 05/23/18  Yes [provider]  ?calcium carbonate (OSCAL) 1500 (600 Ca) MG TABS tablet Take 1 tablet by mouth daily with breakfast.   Yes [provider]  ?insulin lispro (HUMALOG) 100 UNIT/ML injection Inject 6 Units into the skin once.   Yes [provider]  ?metFORMIN (GLUCOPHAGE) 500 MG tablet Take 500 mg by mouth 2 (two) times daily with a meal.   Yes [provider]  ?tamsulosin (FLOMAX) 0.4 MG CAPS capsule Take  1 capsule (0.4 mg total) by mouth daily. ?Patient taking differently: Take 0.4 mg by mouth every evening. 07/08/18  Yes Charlynne Cousins, MD  ?vitamin B-12 (CYANOCOBALAMIN) 500 MCG tablet Take 500 mcg by mouth daily.   Yes [provider]  ?Blood Glucose Monitoring Suppl (ONE TOUCH ULTRA MINI) W/DEVICE KIT Frequency:   Dosage:NULL   KIT  Instructions:Use to check BS daily.  Note:Dx:250.00   Insulin Treated 02/01/12   [provider]  ?glucose blood (FREESTYLE TEST STRIPS) test strip Use as instructed 02/20/14   Ernestina Patches, MD  ?glucose blood test strip Check BS daily. Dx: E11.9 03/07/14   [provider]  ?INS SYRINGE/NEEDLE 1CC/28G 28G X 1/2" 1 ML MISC 31 gage  To use with insulin injections daily. 03/27/13   [provider]  ?Insulin Syringe-Needle U-100 31G X 5/16" 1 ML MISC Use BID for Novolin injections and PRN SS  Novolog 10/16/13   [provider]  ?NOVOFINE PLUS 32G X 4 MM MISC See admin instructions. 03/21/14   [provider]  ?ReliOn Ultra Thin Lancets MISC Use to check BS three times daily. 03/03/10   [provider]  ? ? ?Physical Exam: ?Vitals:  ? 05/13/21 1128 05/13/21 1156 05/13/21 1156 05/13/21 1230  ?BP: 129/71   116/77  ?Pulse: 88   85  ?Resp: 18   18  ?Temp:  98.8 ?F (37.1 ?C)    ?TempSrc:  Rectal    ?SpO2: 100%   99%  ?Weight:   59 kg   ?Height:   _0  (1.727 m)   ? ?Physical Exam ?Vitals and nursing note reviewed.  ?Constitutional:   ?   Appearance: He is normal weight. He is ill-appearing.  ?HENT:  ?   Head: Normocephalic.  ?   Mouth/Throat:  ?   Mouth: Mucous membranes are dry.  ?Eyes:  ?   General: No scleral icterus. ?   Pupils: Pupils are equal, round, and reactive to light.  ?Neck:  ?   Vascular: No JVD.  ?Cardiovascular:  ?   Rate and Rhythm: Normal rate and regular rhythm.  ?   Heart sounds: S1 normal and S2 normal.  ?Pulmonary:  ?   Effort: Pulmonary effort is normal.  ?   Breath sounds: Examination of the right-lower  field reveals decreased breath sounds. Examination of the left-lower field reveals decreased breath sounds. Decreased breath sounds present. No wheezing, rhonchi or rales.  ?Abdominal:  ?   General: Bowel sounds are normal. There is no distension.  ?   Palpations: Abdomen is soft.  ?   Tenderness: There is abdominal tenderness in the epigastric area and periumbilical area. There is no right CVA tenderness, left CVA tenderness, guarding or rebound.  ?Musculoskeletal:  ?   Cervical back: Neck supple.  ?   Right lower leg: No edema.  ?   Left lower leg: No edema.  ?Skin: ?   General: Skin is warm and dry.  ?   Coloration: Skin is not jaundiced.  ?  Comments: Positive onychomycosis of toenails.  ?Neurological:  ?   General: No focal deficit present.  ?   Mental Status: He is easily aroused. He is disoriented.  ?Psychiatric:     ?   Mood and Affect: Mood normal.     ?   Behavior: Behavior normal.  ? ?Data Reviewed: ? ?There are no new results to review at this time. ? ?Assessment and Plan: ?Principal Problem: ?  Acute metabolic encephalopathy  ?Secondary to: ?  Sepsis due to undetermined organism POA ([place]) ?Admit to PCU/inpatient. ?Continue IV fluids. ?Continue cefepime 2 g every 8 hours.   ?Continue metronidazole 500 mg IVPB q 12 hr. ?Continue vancomycin per pharmacy. ?Follow-up blood culture and sensitivity ?Follow CBC and CMP in a.m  ? ?Active Problems: ?  AKI (acute kidney injury) ([place]) ?Hold metformin. ?Continue IV hydration. ?Avoid hypotension ?Avoid nephrotoxins. ?Monitor intake and output. ?Follow-up renal function electrolytes. ? ?  Hypernatremia ?Corrected sodium is 163 mmol/L. ?Hydrate with NaCl 0.45% ? ?  Type 2 diabetes mellitus with hyperglycemia ([facility]) ?Carbohydrate modified/renal diet. ?CBG monitoring with RI SS. ?Check hemoglobin A1c. ? ?  Hypercholesteremia ?Continue atorvastatin 10 mg p.o. daily. ? ?  Hypertension ?Antihypertensives as needed. ?Monitor blood pressure. ? ?  CAD (coronary artery  disease) ?On atorvastatin. ?Not on ASA or beta-blockers. ? ?  Polyneuropathy due to type 2 diabetes mellitus ([place]) ?Blood glucose control. ?Analgesics as needed. ? ? ? Advance Care Planning:   Code Status: DNR  ? ?Consults:

## 2021-05-14 DIAGNOSIS — A419 Sepsis, unspecified organism: Secondary | ICD-10-CM | POA: Diagnosis not present

## 2021-05-14 LAB — BLOOD CULTURE ID PANEL (REFLEXED) - BCID2

## 2021-05-14 LAB — CBC WITH DIFFERENTIAL/PLATELET
Abs Immature Granulocytes: 0.14 10*3/uL — ABNORMAL HIGH (ref 0.00–0.07)
Basophils Absolute: 0 10*3/uL (ref 0.0–0.1)
Basophils Relative: 0 %
Eosinophils Absolute: 0 10*3/uL (ref 0.0–0.5)
Eosinophils Relative: 0 %
HCT: 40.3 % (ref 39.0–52.0)
Hemoglobin: 12.7 g/dL — ABNORMAL LOW (ref 13.0–17.0)
Immature Granulocytes: 1 %
Lymphocytes Relative: 5 %
Lymphs Abs: 0.8 10*3/uL (ref 0.7–4.0)
MCH: 28 pg (ref 26.0–34.0)
MCHC: 31.5 g/dL (ref 30.0–36.0)
MCV: 89 fL (ref 80.0–100.0)
Monocytes Absolute: 1.2 10*3/uL — ABNORMAL HIGH (ref 0.1–1.0)
Monocytes Relative: 7 %
Neutro Abs: 14.3 10*3/uL — ABNORMAL HIGH (ref 1.7–7.7)
Neutrophils Relative %: 87 %
Platelets: 343 10*3/uL (ref 150–400)
RBC: 4.53 MIL/uL (ref 4.22–5.81)
RDW: 15.8 % — ABNORMAL HIGH (ref 11.5–15.5)
WBC: 16.5 10*3/uL — ABNORMAL HIGH (ref 4.0–10.5)
nRBC: 0 % (ref 0.0–0.2)

## 2021-05-14 LAB — COMPREHENSIVE METABOLIC PANEL
ALT: 8 U/L (ref 0–44)
AST: 10 U/L — ABNORMAL LOW (ref 15–41)
Albumin: 2.6 g/dL — ABNORMAL LOW (ref 3.5–5.0)
Alkaline Phosphatase: 82 U/L (ref 38–126)
Anion gap: 11 (ref 5–15)
BUN: 86 mg/dL — ABNORMAL HIGH (ref 8–23)
CO2: 24 mmol/L (ref 22–32)
Calcium: 9.4 mg/dL (ref 8.9–10.3)
Chloride: 117 mmol/L — ABNORMAL HIGH (ref 98–111)
Creatinine, Ser: 2.26 mg/dL — ABNORMAL HIGH (ref 0.61–1.24)
GFR, Estimated: 29 mL/min — ABNORMAL LOW (ref >60)
Glucose, Bld: 373 mg/dL — ABNORMAL HIGH (ref 70–99)
Potassium: 3.6 mmol/L (ref 3.5–5.1)
Sodium: 152 mmol/L — ABNORMAL HIGH (ref 135–145)
Total Bilirubin: 0.8 mg/dL (ref 0.3–1.2)
Total Protein: 6 g/dL — ABNORMAL LOW (ref 6.5–8.1)

## 2021-05-14 LAB — CK: Total CK: 75 U/L (ref 49–397)

## 2021-05-14 LAB — GLUCOSE, CAPILLARY
Glucose-Capillary: 289 mg/dL — ABNORMAL HIGH (ref 70–99)
Glucose-Capillary: 276 mg/dL — ABNORMAL HIGH (ref 70–99)
Glucose-Capillary: 265 mg/dL — ABNORMAL HIGH (ref 70–99)
Glucose-Capillary: 149 mg/dL — ABNORMAL HIGH (ref 70–99)

## 2021-05-14 LAB — HEMOGLOBIN A1C
Hgb A1c MFr Bld: 11.5 % — ABNORMAL HIGH (ref 4.8–5.6)
Mean Plasma Glucose: 283 mg/dL

## 2021-05-14 LAB — URINALYSIS, ROUTINE W REFLEX MICROSCOPIC
Bilirubin Urine: NEGATIVE
Glucose, UA: >=500 mg/dL — AB
Ketones, ur: 5 mg/dL — AB
Nitrite: NEGATIVE
Protein, ur: NEGATIVE mg/dL
Specific Gravity, Urine: 1.014 (ref 1.005–1.030)
pH: 5 (ref 5.0–8.0)

## 2021-05-14 MED ORDER — POLYETHYLENE GLYCOL 3350 17 G PO PACK
17.0000 g | PACK | Freq: Two times a day (BID) | ORAL | Status: DC
  Administered 2021-05-14 – 2021-05-18 (×8): 17 g via ORAL
  Filled 2021-05-14 (×9): qty 1

## 2021-05-14 MED ORDER — CHLORHEXIDINE GLUCONATE CLOTH 2 % EX PADS
6.0000 | MEDICATED_PAD | Freq: Every day | CUTANEOUS | Status: DC
  Administered 2021-05-14 – 2021-05-18 (×5): 6 via TOPICAL

## 2021-05-14 MED ORDER — SODIUM CHLORIDE 0.9 % IV SOLN
2.0000 g | INTRAVENOUS | Status: DC
  Administered 2021-05-15 – 2021-05-16 (×2): 2 g via INTRAVENOUS
  Filled 2021-05-14 (×2): qty 20

## 2021-05-14 MED ORDER — INSULIN GLARGINE-YFGN 100 UNIT/ML ~~LOC~~ SOLN
3.0000 [IU] | Freq: Every day | SUBCUTANEOUS | Status: DC
  Administered 2021-05-14 – 2021-05-18 (×5): 3 [IU] via SUBCUTANEOUS
  Filled 2021-05-14 (×5): qty 0.03

## 2021-05-14 MED ORDER — ORAL CARE MOUTH RINSE
15.0000 mL | Freq: Two times a day (BID) | OROMUCOSAL | Status: DC
  Administered 2021-05-15 – 2021-05-17 (×2): 15 mL via OROMUCOSAL

## 2021-05-14 MED ORDER — BISACODYL 10 MG RE SUPP
10.0000 mg | Freq: Every day | RECTAL | Status: AC
  Administered 2021-05-14 – 2021-05-15 (×2): 10 mg via RECTAL
  Filled 2021-05-14 (×2): qty 1

## 2021-05-14 MED ORDER — SENNOSIDES-DOCUSATE SODIUM 8.6-50 MG PO TABS
1.0000 | ORAL_TABLET | Freq: Two times a day (BID) | ORAL | Status: DC
  Administered 2021-05-14 – 2021-05-18 (×8): 1 via ORAL
  Filled 2021-05-14 (×9): qty 1

## 2021-05-14 MED ORDER — CHLORHEXIDINE GLUCONATE 0.12 % MT SOLN
15.0000 mL | Freq: Two times a day (BID) | OROMUCOSAL | Status: DC
  Administered 2021-05-14 – 2021-05-18 (×8): 15 mL via OROMUCOSAL
  Filled 2021-05-14 (×9): qty 15

## 2021-05-14 NOTE — Progress Notes (Signed)
Catheterization attempted with coude catheter by 3 RNs, met with resistance, patient c/o severe pain.  Unable to navigate past resistance.  MD notified of need for urology consult. ? ?Angie Fava, RN  ?

## 2021-05-14 NOTE — Progress Notes (Signed)
Per nursing tech, patient was fed french toast, which he chewed but would not swallow, requiring suction to remove chewed food.  Patient then requested milk, which he also did not swallow.  Milk dribbled out of mouth.  Patient said, "I told you I swallowed it!"  MD notified. ? ?Bradd Burner, RN  ?

## 2021-05-14 NOTE — Progress Notes (Signed)
PHARMACY - PHYSICIAN COMMUNICATION ?CRITICAL VALUE ALERT - BLOOD CULTURE IDENTIFICATION (BCID) ? ?Mitchell Scott is an 81 y.o. male who presented to [facility] on 05/13/2021 with a chief complaint of altered mental status ? ?Assessment:  Unknown source- possible urinary. ?Blood cx + GNR, BCID + Ecoli without resistance.  ? ?Name of physician (or Provider) Contacted: Clarene Essex ? ?Current antibiotics: Cefepime + Flagyl + Vancomycin  ? ?Changes to prescribed antibiotics recommended:  ?De-escalate antibiotics to Rocephin 2gm IV q24h ? ?Results for orders placed or performed during the hospital encounter of 05/13/21  ?Blood Culture ID Panel (Reflexed) (Collected: 05/13/2021 10:22 AM)  ?Result Value Ref Range  ? Enterococcus faecalis NOT DETECTED NOT DETECTED  ? Enterococcus Faecium NOT DETECTED NOT DETECTED  ? Listeria monocytogenes NOT DETECTED NOT DETECTED  ? Staphylococcus species NOT DETECTED NOT DETECTED  ? Staphylococcus aureus (BCID) NOT DETECTED NOT DETECTED  ? Staphylococcus epidermidis NOT DETECTED NOT DETECTED  ? Staphylococcus lugdunensis NOT DETECTED NOT DETECTED  ? Streptococcus species NOT DETECTED NOT DETECTED  ? Streptococcus agalactiae NOT DETECTED NOT DETECTED  ? Streptococcus pneumoniae NOT DETECTED NOT DETECTED  ? Streptococcus pyogenes NOT DETECTED NOT DETECTED  ? A.calcoaceticus-baumannii NOT DETECTED NOT DETECTED  ? Bacteroides fragilis NOT DETECTED NOT DETECTED  ? Enterobacterales DETECTED (A) NOT DETECTED  ? Enterobacter cloacae complex NOT DETECTED NOT DETECTED  ? Escherichia coli DETECTED (A) NOT DETECTED  ? Klebsiella aerogenes NOT DETECTED NOT DETECTED  ? Klebsiella oxytoca NOT DETECTED NOT DETECTED  ? Klebsiella pneumoniae NOT DETECTED NOT DETECTED  ? Proteus species NOT DETECTED NOT DETECTED  ? Salmonella species NOT DETECTED NOT DETECTED  ? Serratia marcescens NOT DETECTED NOT DETECTED  ? Haemophilus influenzae NOT DETECTED NOT DETECTED  ? Neisseria meningitidis NOT DETECTED NOT DETECTED  ?  Pseudomonas aeruginosa NOT DETECTED NOT DETECTED  ? Stenotrophomonas maltophilia NOT DETECTED NOT DETECTED  ? Candida albicans NOT DETECTED NOT DETECTED  ? Candida auris NOT DETECTED NOT DETECTED  ? Candida glabrata NOT DETECTED NOT DETECTED  ? Candida krusei NOT DETECTED NOT DETECTED  ? Candida parapsilosis NOT DETECTED NOT DETECTED  ? Candida tropicalis NOT DETECTED NOT DETECTED  ? Cryptococcus neoformans/gattii NOT DETECTED NOT DETECTED  ? CTX-M ESBL NOT DETECTED NOT DETECTED  ? Carbapenem resistance IMP NOT DETECTED NOT DETECTED  ? Carbapenem resistance KPC NOT DETECTED NOT DETECTED  ? Carbapenem resistance NDM NOT DETECTED NOT DETECTED  ? Carbapenem resist OXA 48 LIKE NOT DETECTED NOT DETECTED  ? Carbapenem resistance VIM NOT DETECTED NOT DETECTED  ? ? ?Netta Cedars PharmD ?05/14/2021  4:02 AM ? ?

## 2021-05-14 NOTE — Progress Notes (Signed)
Bladder scan showed 315 mL urine.  MD notified. ? ?Bradd Burner, RN  ?

## 2021-05-14 NOTE — Evaluation (Addendum)
Clinical/Bedside Swallow Evaluation ?Patient Details  ?Name: Mitchell Scott ?MRN: [id number] ?Date of Birth: 1940-01-08 ? ?Today's Date: 05/14/2021 ?Time: SLP Start Time (ACUTE ONLY): 1047 SLP Stop Time (ACUTE ONLY): 1101 ?SLP Time Calculation (min) (ACUTE ONLY): 14 min ? ?Past Medical History:  ?Past Medical History:  ?Diagnosis Date  ? Diabetes mellitus without complication (HCC)   ? Heart attack (HCC)   ? Hypercholesteremia   ? Hypertension   ? Liver damage   ? SDH (subdural hematoma) (HCC)   ? ?Past Surgical History:  ?Past Surgical History:  ?Procedure Laterality Date  ? BALLOON DILATION N/A 10/08/2020  ? Procedure: BALLOON DILATION;  Surgeon: Charlott Rakes, MD;  Location: [facility];  Service: Endoscopy;  Laterality: N/A;  ? BIOPSY  10/08/2020  ? Procedure: BIOPSY;  Surgeon: Charlott Rakes, MD;  Location: [facility];  Service: Endoscopy;;  ? ESOPHAGOGASTRODUODENOSCOPY (EGD) WITH PROPOFOL N/A 10/08/2020  ? Procedure: ESOPHAGOGASTRODUODENOSCOPY (EGD) WITH PROPOFOL;  Surgeon: Charlott Rakes, MD;  Location: [facility];  Service: Endoscopy;  Laterality: N/A;  possible dilation  ? TONSILLECTOMY    ? ?HPI:  ?Mitchell Scott is a 81 y.o. male with medical history significant of type 2 diabetes, diabetic polyneuropathy, CAD, history MI, hyperlipidemia, hypertension, liver damage, subdural hematoma, COVID-19 who is sent from his facility due to altered mental status. Per chart he has been hyperglycemic in the 450. Portable chest radiograph with no active disease. Found to have acute metabolic encephalopathy from sepsis due to undetermined organism POA, acute kidney injury. BSE 06/2021 no concern for oropharyngeal dysphagia, regular texture, thin liquids and ST signed off  ?  ?Assessment / Plan / Recommendation  ?Clinical Impression ? Pt demosntrating a cognitive based dysphagia with behaviors inline with his present decreased mentation which does appear to be improving per notes. He was awake, following commands  and requesting water. RN reported pt held solids and liquids this am and was unable to transit. Oral-motor strength was unremarkable during examinaiton. Dentition is sparse and in poor condition (8 teeth). Exhibted reduced bolus control and preparation as he masticated the applesauce with decreased cohesion and transit delays. Mastication of solid cracker was prolonged although no residue notes. There was intermittent hesitation and prolonged propulsion with straw sips thin particularily when larger sips consumed. No cough, throat clear and vocal qualtiy was clear throughout. Mild labial leakage of saliva noted on left side at end of assessment. He will need assist and supervision with meals of Dys 2, thin liquids, pills whole in puree and continued ST to facilitate swallow and upgrade as pt appropriate. ?SLP Visit Diagnosis: Dysphagia, unspecified (R13.10) ?   ?Aspiration Risk ? Mild aspiration risk  ?  ?Diet Recommendation Dysphagia 2 (Fine chop);Thin liquid  ? ?Liquid Administration via: Straw;Cup ?Medication Administration: Whole meds with puree ?Supervision: Staff to assist with self feeding;Full supervision/cueing for compensatory strategies ?Compensations: Minimize environmental distractions;Slow rate;Small sips/bites;Lingual sweep for clearance of pocketing ?Postural Changes: Seated upright at 90 degrees  ?  ?Other  Recommendations Oral Care Recommendations: Oral care BID   ? ?Recommendations for follow up therapy are one component of a multi-disciplinary discharge planning process, led by the attending physician.  Recommendations may be updated based on patient status, additional functional criteria and insurance authorization. ? ?Follow up Recommendations  (TBD)  ? ? ?  ?Assistance Recommended at Discharge Frequent or constant Supervision/Assistance  ?Functional Status Assessment Patient has had a recent decline in their functional status and demonstrates the ability to make significant improvements in  function in a reasonable  and predictable amount of time.  ?Frequency and Duration min 2x/week  ?2 weeks ?  ?   ? ?Prognosis Prognosis for Safe Diet Advancement: Good ?Barriers to Reach Goals: Cognitive deficits  ? ?  ? ?Swallow Study   ?General Date of Onset: 05/13/21 ?HPI: Mitchell Scott is a 81 y.o. male with medical history significant of type 2 diabetes, diabetic polyneuropathy, CAD, history MI, hyperlipidemia, hypertension, liver damage, subdural hematoma, COVID-19 who is sent from his facility due to altered mental status. Per chart he has been hyperglycemic in the 450. Portable chest radiograph with no active disease. Found to have acute metabolic encephalopathy from sepsis due to undetermined organism POA, acute kidney injury. BSE 06/2021 no concern for oropharyngeal dysphagia, regular texture, thin liquids and ST signed off ?Type of Study: Bedside Swallow Evaluation ?Previous Swallow Assessment:  (see HPI) ?Diet Prior to this Study: NPO ?Temperature Spikes Noted: No ?Respiratory Status: Room air ?History of Recent Intubation: No ?Behavior/Cognition: Alert;Cooperative;Pleasant mood;Requires cueing ?Oral Cavity Assessment: Within Functional Limits ?Oral Care Completed by SLP: No ?Oral Cavity - Dentition: Poor condition;Missing dentition (total of 8 teeth) ?Vision: Functional for self-feeding ?Self-Feeding Abilities: Needs assist ?Patient Positioning: Upright in bed ?Baseline Vocal Quality: Normal ?Volitional Cough: Weak ?Volitional Swallow: Able to elicit  ?  ?Oral/Motor/Sensory Function Overall Oral Motor/Sensory Function: Within functional limits   ?Ice Chips Ice chips: Not tested   ?Thin Liquid Thin Liquid: Within functional limits ?Presentation: Cup;Straw  ?  ?Nectar Thick Nectar Thick Liquid: Not tested   ?Honey Thick Honey Thick Liquid: Not tested   ?Puree Puree: Impaired ?Presentation: Spoon ?Oral Phase Impairments: Poor awareness of bolus;Reduced lingual movement/coordination ?Oral Phase Functional  Implications: Prolonged oral transit (poor bolus cohesion and formation) ?Pharyngeal Phase Impairments:  (none)   ?Solid ? ? ?  Solid: Impaired ?Oral Phase Impairments: Reduced lingual movement/coordination;Impaired mastication ?Oral Phase Functional Implications: Prolonged oral transit ?Pharyngeal Phase Impairments:  (none)  ? ?  ? ?Royce Macadamia ?05/14/2021,11:18 AM ? ? ? ?

## 2021-05-14 NOTE — Consult Note (Signed)
?Urology Consult  ? ?Physician requesting consult: Dr. Erlinda Hong ? ?Reason for consult: Difficult urethral catheterization ? ?History of Present Illness: Mitchell Scott is a 81 y.o. male with medical history significant of type 2 diabetes, diabetic polyneuropathy, CAD, history MI, hyperlipidemia, hypertension, liver damage, subdural hematoma, COVID-19 who is sent from his facility due to altered mental status.  At the last time he was seen at baseline at the facility was 3 days ago.  The facility nursing staff stated that he has been hyperglycemic in the 450s and his activity level has decreased substantially.  His initial GCS was 8 in the emergency department. He has been confirmed to have Enterobacter bacteremia. He was unable to void and required in and out catheterization in overnight but nursing has been unable to place a catheter this afternoon despite multiple attempts. ? ? ?Past Medical History:  ?Diagnosis Date  ? Diabetes mellitus without complication ([place])   ? Heart attack ([place])   ? Hypercholesteremia   ? Hypertension   ? Liver damage   ? SDH (subdural hematoma) (HCC)   ? ? ?Past Surgical History:  ?Procedure Laterality Date  ? BALLOON DILATION N/A 10/08/2020  ? Procedure: BALLOON DILATION;  Surgeon: Wilford Corner, MD;  Location: [facility];  Service: Endoscopy;  Laterality: N/A;  ? BIOPSY  10/08/2020  ? Procedure: BIOPSY;  Surgeon: Wilford Corner, MD;  Location: [facility];  Service: Endoscopy;;  ? ESOPHAGOGASTRODUODENOSCOPY (EGD) WITH PROPOFOL N/A 10/08/2020  ? Procedure: ESOPHAGOGASTRODUODENOSCOPY (EGD) WITH PROPOFOL;  Surgeon: Wilford Corner, MD;  Location: [facility];  Service: Endoscopy;  Laterality: N/A;  possible dilation  ? TONSILLECTOMY    ? ? ?Medications: ? ?Home meds:  ?No current facility-administered medications on file prior to encounter.  ? ?Current Outpatient Medications on File Prior to Encounter  ?Medication Sig Dispense Refill  ? atorvastatin (LIPITOR) 20 MG tablet Take 10 mg  by mouth daily.    ? calcium carbonate (OSCAL) 1500 (600 Ca) MG TABS tablet Take 1 tablet by mouth daily with breakfast.    ? insulin lispro (HUMALOG) 100 UNIT/ML injection Inject 6 Units into the skin once.    ? metFORMIN (GLUCOPHAGE) 500 MG tablet Take 500 mg by mouth 2 (two) times daily with a meal.    ? tamsulosin (FLOMAX) 0.4 MG CAPS capsule Take 1 capsule (0.4 mg total) by mouth daily. (Patient taking differently: Take 0.4 mg by mouth every evening.) 30 capsule 0  ? vitamin B-12 (CYANOCOBALAMIN) 500 MCG tablet Take 500 mcg by mouth daily.    ? Blood Glucose Monitoring Suppl (ONE TOUCH ULTRA MINI) W/DEVICE KIT Frequency:   Dosage:NULL   KIT  Instructions:Use to check BS daily.  Note:Dx:250.00   Insulin Treated    ? glucose blood (FREESTYLE TEST STRIPS) test strip Use as instructed 100 each 12  ? glucose blood test strip Check BS daily. Dx: E11.9    ? INS SYRINGE/NEEDLE 1CC/28G 28G X 1/2" 1 ML MISC 31 gage  To use with insulin injections daily.    ? Insulin Syringe-Needle U-100 31G X 5/16" 1 ML MISC Use BID for Novolin injections and PRN SS  Novolog    ? NOVOFINE PLUS 32G X 4 MM MISC See admin instructions.  6  ? ReliOn Ultra Thin Lancets MISC Use to check BS three times daily.    ? ? ? ?Scheduled Meds: ? atorvastatin  10 mg Oral Daily  ? bisacodyl  10 mg Rectal Daily  ? chlorhexidine  15 mL Mouth Rinse BID  ?  Chlorhexidine Gluconate Cloth  6 each Topical Daily  ? enoxaparin (LOVENOX) injection  30 mg Subcutaneous Q24H  ? insulin aspart  0-9 Units Subcutaneous TID WC  ? insulin glargine-yfgn  3 Units Subcutaneous Daily  ? mouth rinse  15 mL Mouth Rinse q12n4p  ? polyethylene glycol  17 g Oral BID  ? senna-docusate  1 tablet Oral BID  ? tamsulosin  0.4 mg Oral QPM  ? vitamin B-12  500 mcg Oral Daily  ? ?Continuous Infusions: ? sodium chloride 150 mL/hr at 05/14/21 1252  ? [START ON 05/15/2021] cefTRIAXone (ROCEPHIN)  IV    ? ?PRN Meds:.acetaminophen **OR** acetaminophen, ondansetron **OR** ondansetron (ZOFRAN)  IV ? ?Allergies:  ?Allergies  ?Allergen Reactions  ? Codeine   ?  Other reaction(s): GASTRIC UPSET  ? Penicillins Itching and Rash  ?  Tolerates pip/tazo  ? ? ?History reviewed. No pertinent family history. ? ?Social History:  reports that he has never smoked. He has never used smokeless tobacco. He reports that he does not drink alcohol and does not use drugs. ? ?ROS: ?A complete review of systems was performed.  All systems are negative except for pertinent findings as noted. ? ?Physical Exam:  ?Vital signs in last 24 hours: ?Temp:  [97.6 ?F (36.4 ?C)-97.7 ?F (36.5 ?C)] 97.7 ?F (36.5 ?C) (05/10 3762) ?Pulse Rate:  [63-82] 64 (05/10 1120) ?Resp:  [16-18] 16 (05/10 1120) ?BP: (103-127)/(64-73) 103/64 (05/10 1120) ?SpO2:  [99 %-100 %] 100 % (05/10 1120) ?Weight:  [56.6 kg] 56.6 kg (05/09 2011) ?Constitutional:  No acute distress ?Cardiovascular: No JVD ?Respiratory: Normal respiratory effort ?GI: Abdomen is soft, nontender, nondistended, no abdominal masses ?Genitourinary: No CVAT. Normal male phallus, catheter in place with grossly clear urine ?Lymphatic: No lymphadenopathy ?Neurologic: Grossly intact, no focal deficits ?Psychiatric: Somnolent ? ?Laboratory Data:  ?Recent Labs  ?  05/13/21 ?1022 05/14/21 ?0411  ?WBC 22.2* 16.5*  ?HGB 15.6 12.7*  ?HCT 46.4 40.3  ?PLT 430* 343  ? ? ?Recent Labs  ?  05/13/21 ?1022 05/14/21 ?0411  ?NA 153* 152*  ?K 4.4 3.6  ?CL 114* 117*  ?GLUCOSE 415* 373*  ?BUN 94* 86*  ?CALCIUM 10.4* 9.4  ?CREATININE 2.95* 2.26*  ? ? ? ?Results for orders placed or performed during the hospital encounter of 05/13/21 (from the past 24 hour(s))  ?MRSA Next Gen by PCR, Nasal     Status: None  ? Collection Time: 05/13/21  8:40 PM  ? Specimen: Nasal Mucosa; Nasal Swab  ?Result Value Ref Range  ? MRSA by PCR Next Gen NOT DETECTED NOT DETECTED  ?Glucose, capillary     Status: Abnormal  ? Collection Time: 05/13/21  9:49 PM  ?Result Value Ref Range  ? Glucose-Capillary 392 (H) 70 - 99 mg/dL  ?Urinalysis,  Routine w reflex microscopic Nasal Mucosa     Status: Abnormal  ? Collection Time: 05/14/21  4:11 AM  ?Result Value Ref Range  ? Color, Urine YELLOW YELLOW  ? APPearance HAZY (A) CLEAR  ? Specific Gravity, Urine 1.014 1.005 - 1.030  ? pH 5.0 5.0 - 8.0  ? Glucose, UA >=500 (A) NEGATIVE mg/dL  ? Hgb urine dipstick SMALL (A) NEGATIVE  ? Bilirubin Urine NEGATIVE NEGATIVE  ? Ketones, ur 5 (A) NEGATIVE mg/dL  ? Protein, ur NEGATIVE NEGATIVE mg/dL  ? Nitrite NEGATIVE NEGATIVE  ? Leukocytes,Ua MODERATE (A) NEGATIVE  ? RBC / HPF 0-5 0 - 5 RBC/hpf  ? WBC, UA 21-50 0 - 5 WBC/hpf  ? Bacteria, UA  RARE (A) NONE SEEN  ? Squamous Epithelial / LPF 0-5 0 - 5  ? Mucus PRESENT   ?CBC with Differential     Status: Abnormal  ? Collection Time: 05/14/21  4:11 AM  ?Result Value Ref Range  ? WBC 16.5 (H) 4.0 - 10.5 K/uL  ? RBC 4.53 4.22 - 5.81 MIL/uL  ? Hemoglobin 12.7 (L) 13.0 - 17.0 g/dL  ? HCT 40.3 39.0 - 52.0 %  ? MCV 89.0 80.0 - 100.0 fL  ? MCH 28.0 26.0 - 34.0 pg  ? MCHC 31.5 30.0 - 36.0 g/dL  ? RDW 15.8 (H) 11.5 - 15.5 %  ? Platelets 343 150 - 400 K/uL  ? nRBC 0.0 0.0 - 0.2 %  ? Neutrophils Relative % 87 %  ? Neutro Abs 14.3 (H) 1.7 - 7.7 K/uL  ? Lymphocytes Relative 5 %  ? Lymphs Abs 0.8 0.7 - 4.0 K/uL  ? Monocytes Relative 7 %  ? Monocytes Absolute 1.2 (H) 0.1 - 1.0 K/uL  ? Eosinophils Relative 0 %  ? Eosinophils Absolute 0.0 0.0 - 0.5 K/uL  ? Basophils Relative 0 %  ? Basophils Absolute 0.0 0.0 - 0.1 K/uL  ? Immature Granulocytes 1 %  ? Abs Immature Granulocytes 0.14 (H) 0.00 - 0.07 K/uL  ?Comprehensive metabolic panel     Status: Abnormal  ? Collection Time: 05/14/21  4:11 AM  ?Result Value Ref Range  ? Sodium 152 (H) 135 - 145 mmol/L  ? Potassium 3.6 3.5 - 5.1 mmol/L  ? Chloride 117 (H) 98 - 111 mmol/L  ? CO2 24 22 - 32 mmol/L  ? Glucose, Bld 373 (H) 70 - 99 mg/dL  ? BUN 86 (H) 8 - 23 mg/dL  ? Creatinine, Ser 2.26 (H) 0.61 - 1.24 mg/dL  ? Calcium 9.4 8.9 - 10.3 mg/dL  ? Total Protein 6.0 (L) 6.5 - 8.1 g/dL  ? Albumin 2.6 (L) 3.5  - 5.0 g/dL  ? AST 10 (L) 15 - 41 U/L  ? ALT 8 0 - 44 U/L  ? Alkaline Phosphatase 82 38 - 126 U/L  ? Total Bilirubin 0.8 0.3 - 1.2 mg/dL  ? GFR, Estimated 29 (L) >60 mL/min  ? Anion gap 11 5 - 15  ?CK     Sta

## 2021-05-14 NOTE — Procedures (Signed)
Foley Catheter Placement Note ? ?Indications: 81 y.o. male who is admitted due to altered mental status. Bladder scan reveals >400cc urine. Attempts at catheter placement x 3 were unsuccessful so urology was consulted for catheter placement.  ? ?Pre-operative Diagnosis: Urinary retention, altered mental status  ? ?Post-operative Diagnosis: Same ? ?Surgeon: Judson Roch, MD ? ?Assistants: None ? ?Procedure Details  ?Patient was placed in the supine position, prepped with Betadine and draped in the usual sterile fashion.  We injected lidocaine jelly per urethra prior to the procedure.  We then inserted a 18 [place] coude catheter per urethra which easily passed into the bladder without any resistance at the prostatic urethra.  We achieved return of clear yellow urine and then proceeded to insert 10 mL of sterile water into the Foley balloon.  The catheter was attached to a drainage bag and secured with a StatLock.  Placement of the catheter had return of 650cc clear yellow urine.  ?              ?Complications: None; patient tolerated the procedure well. ?      ?Attending Attestation: Dr. Laverle Patter was available. ? ?  ?

## 2021-05-14 NOTE — Progress Notes (Signed)
Inpatient Diabetes Program Recommendations ? ?AACE/ADA: New Consensus Statement on Inpatient Glycemic Control (2015) ? ?Target Ranges:  Prepandial:   less than 140 mg/dL ?     [place] postprandial:   less than 180 mg/dL (1-2 hours) ?     Critically ill patients:  140 - 180 mg/dL  ? ?Lab Results  ?Component Value Date  ? GLUCAP 289 (H) 05/14/2021  ? HGBA1C 11.5 (H) 05/13/2021  ? ? ?Review of Glycemic Control ? Latest Reference Range & Units 05/13/21 10:26 05/13/21 16:36 05/13/21 21:49 05/14/21 07:35  ?Glucose-Capillary 70 - 99 mg/dL 355 (H) 439 (H) 392 (H) 289 (H)  ?(H): Data is abnormally high ? ?Diabetes history: DM ?Outpatient Diabetes medications: Metformin 500 mg BID, Humalog 6 units daily?? ?Current orders for Inpatient glycemic control: Semglee 3 units QD, Novolog 0-9 units TID.   ? ?Inpatient Diabetes Program Recommendations:   ? ?A1C 11.5% (average blood glucose of 283 mg/dL).  Please consider increasing Semglee to 6 units QD. ? ?Attempted to speak with him; unable to arouse.   ? ?Will continue to follow while inpatient. ? ?Thank you, ?Reche Dixon, MSN, CDCES ?Diabetes Coordinator ?Inpatient Diabetes Program ?[phone] (team pager from 8a-5p) ? ? ? ?

## 2021-05-14 NOTE — Progress Notes (Signed)
?PROGRESS NOTE ? ? ? ?Mitchell Scott  [id number] DOB: May 25, 1940 DOA: 05/13/2021 ?PCP: Raymondo Band, MD  ? ? ? ?Brief Narrative:  ? ?Mitchell Scott is a 81 y.o. male with medical history significant of type 2 diabetes, diabetic polyneuropathy, CAD, history MI, hyperlipidemia, hypertension, liver damage, subdural hematoma, COVID-19 who is sent from his facility due to altered mental status.  At the last time he was seen at baseline at the facility was 3 days ago.  The facility nursing staff stated that he has been hyperglycemic in the 450s and his activity level has decreased substantially.  His initial GCS was 8 in the emergency department.  He is now waking up but unable to provide any meaningful HPI. ? ?Subjective: ? ?Had urinary retention, foley placed by urology this pm ?He is not a reliable historian, he currently does not appear in pain, knows he is in [place], oriented to self, not oriented to time ?Attempt to follow commands, but overall very weak appearing ?Speech eval order dysphagia 2 diet ? ?Assessment & Plan: ? Principal Problem: ?  Sepsis due to undetermined organism [facility]) ?Active Problems: ?  Hypertension ?  Hypercholesteremia ?  Acute metabolic encephalopathy ?  CAD (coronary artery disease) ?  Polyneuropathy due to type 2 diabetes mellitus ([place]) ?  AKI (acute kidney injury) ([place]) ?  Hypernatremia ?  Type 2 diabetes mellitus with hyperglycemia (HCC) ? ? ? ?Assessment and Plan: ? ? Acute metabolic encephalopathy /   Sepsis /g- bacteremia  ?CT head no acute findings ?Continue IV fluids. ?Continue cefepime 2 g every 8 hours.   ?Continue metronidazole 500 mg IVPB q 12 hr. ?Follow-up blood culture and sensitivity ?Follow CBC and CMP in a.m  ?  ? ?  AKI (acute kidney injury) (HCC)/acute urinary retention ?4 mm calculus within the right renal collecting system without hydronephrosis shown on CT abdomen pelvis ?Mild prostatomegaly with suggestion of bladder wall thickening,suggesting possible chronic  bladder outlet obstruction. Consider ?correlation with urinalysis to exclude cystitis ?Urine culture in process ?Hold metformin. ?Continue IV hydration. ?Avoid hypotension ?Avoid nephrotoxins. ?Foley insertion ?Monitor intake and output. ?Follow-up renal function electrolytes. ?  ?  Hypernatremia ?Corrected sodium is 163 mmol/L. ?Hydrate with NaCl 0.45% ?  ?  Type 2 diabetes mellitus with hyperglycemia ([place]), uncontrolled with hyperglycemia ?A1c 11.5  ?carbohydrate modified/renal diet. ?CBG monitoring with RI SS, start long-acting insulin, continue to titrate ?  ?  Hypercholesteremia ?Continue atorvastatin 10 mg p.o. daily. ?  ?  Hypertension ?Antihypertensives as needed. ?Monitor blood pressure. ?  ?  CAD (coronary artery disease) ?Aortic and coronary artery atherosclerosis seen on CT scan ?On atorvastatin. ?Not on ASA or beta-blockers. ?  ?  Polyneuropathy due to type 2 diabetes mellitus ([place]) ?Blood glucose control. ?Analgesics as needed. ?  ? The patient?s BMI is: Body mass index is 18.97 kg/m?Marland Kitchen. ? ?.  Constipation ?Large stool burden's in the rectum seen on CT scan ?Suppository and stool softener ? ?Moderately sized hiatal hernia ?Seen on CT scan ? ?  ?Skin Assessment: ?I have examined the patient?s skin and I agree with the wound assessment as performed by the wound care RN as outlined below: ?Pressure Injury 05/13/21 Coccyx Medial Stage 2 -  Partial thickness loss of dermis presenting as a shallow open injury with a red, pink wound bed without slough. unattached edges.  Small dime shape nectrotic issue at bottom (Active)  ?05/13/21 2000  ?Location: Coccyx  ?Location Orientation: Medial  ?Staging: Stage 2 -  Partial  thickness loss of dermis presenting as a shallow open injury with a red, pink wound bed without slough.  ?Wound Description (Comments): unattached edges.  Small dime shape nectrotic issue at bottom  ?Present on Admission: Yes  ?Dressing Type Foam - Lift dressing to assess site every shift 05/13/21  2326  ? ? ? ?I have Reviewed nursing notes, Vitals, pain scores, I/o's, Lab results and  imaging results since pt's last encounter, details please see discussion above  ?I ordered the following labs:  ?Unresulted Labs (From admission, onward)  ? ?  Start     Ordered  ? 05/20/21 0500  Creatinine, serum  (enoxaparin (LOVENOX)    CrCl < 30 ml/min)  Once,   R       ?Comments: while on enoxaparin therapy. ?  ? 05/13/21 1419  ? 05/15/21 0500  CBC with Differential/Platelet  Tomorrow morning,   R       ? 05/14/21 1913  ? 05/15/21 [id number]  Basic metabolic panel  Daily,   R     ? 05/14/21 1913  ? 05/15/21 0500  Magnesium  Tomorrow morning,   R       ? 05/14/21 1913  ? 05/13/21 1022  Urine Culture  (Septic presentation on arrival (screening labs, nursing and treatment orders for obvious sepsis))  ONCE - URGENT,   URGENT       ?Question:  Indication  Answer:  Sepsis  ? 05/13/21 1022  ? ?  ?  ? ?  ? ? ? ?DVT prophylaxis: enoxaparin (LOVENOX) injection 30 mg Start: 05/13/21 2200 ? ? ?Code Status:   Code Status: DNR ? ?Family Communication: none at bedside, plan to call tomorrow ?Disposition:  ? ?Status is: Inpatient ? ? ?Dispo: The patient is from: SNF ?             Anticipated d/c is to: SNF ?             Anticipated d/c date is: TBD ? ?Antimicrobials:   ? ?Anti-infectives (From admission, onward)  ? ? Start     Dose/Rate Route Frequency Ordered Stop  ? 05/15/21 1000  cefTRIAXone (ROCEPHIN) 2 g in sodium chloride 0.9 % 100 mL IVPB       ? 2 g ?200 mL/hr over 30 Minutes Intravenous Every 24 hours 05/14/21 0419    ? 05/15/21 0800  vancomycin (VANCOREADY) IVPB 750 mg/150 mL  Status:  Discontinued       ? 750 mg ?150 mL/hr over 60 Minutes Intravenous Every 48 hours 05/13/21 1512 05/14/21 0419  ? 05/14/21 1000  ceFEPIme (MAXIPIME) 2 g in sodium chloride 0.9 % 100 mL IVPB  Status:  Discontinued       ? 2 g ?200 mL/hr over 30 Minutes Intravenous Every 24 hours 05/13/21 1512 05/14/21 0419  ? 05/13/21 1400  metroNIDAZOLE (FLAGYL) IVPB 500  mg  Status:  Discontinued       ? 500 mg ?100 mL/hr over 60 Minutes Intravenous Every 12 hours 05/13/21 1359 05/14/21 0419  ? 05/13/21 1030  vancomycin (VANCOCIN) IVPB 1000 mg/200 mL premix       ? 1,000 mg ?200 mL/hr over 60 Minutes Intravenous  Once 05/13/21 1022 05/13/21 1335  ? 05/13/21 1030  ceFEPIme (MAXIPIME) 2 g in sodium chloride 0.9 % 100 mL IVPB       ? 2 g ?200 mL/hr over 30 Minutes Intravenous  Once 05/13/21 1022 05/13/21 1111  ? 05/13/21 1030  azithromycin (ZITHROMAX) 500 mg in sodium  chloride 0.9 % 250 mL IVPB       ? 500 mg ?250 mL/hr over 60 Minutes Intravenous  Once 05/13/21 1022 05/13/21 1229  ? ?  ? ? ? ? ? ?Objective: ?Vitals:  ? 05/13/21 2011 05/14/21 0003 05/14/21 0438 05/14/21 1120  ?BP: 126/73 127/68 125/66 103/64  ?Pulse: 82 71 63 64  ?Resp: 16 18 16 16   ?Temp: 97.6 ?F (36.4 ?C) 97.6 ?F (36.4 ?C) 97.7 ?F (36.5 ?C)   ?TempSrc: Oral Oral Oral   ?SpO2: 100% 99% 99% 100%  ?Weight:      ?Height:      ? ? ?Intake/Output Summary (Last 24 hours) at 05/14/2021 1913 ?Last data filed at 05/14/2021 1745 ?Gross per 24 hour  ?Intake 2560 ml  ?Output 1850 ml  ?Net 710 ml  ? ?Filed Weights  ? 05/13/21 1156 05/13/21 2011  ?Weight: 59 kg 56.6 kg  ? ? ?Examination: ? ?General exam: Alert, interactive, attempt to follow command, chronically ill-appearing, very weak, oriented to self, know his in the [place] ?Respiratory system: diminished, no wheezing, no rales, no rhonchi, Respiratory effort normal. ?Cardiovascular system:  RRR.  ?Gastrointestinal system: Abdomen is nondistended, soft and nontender.  Normal bowel sounds heard. ?Central nervous system: Alert and oriented. No focal neurological deficits. ?Extremities:  no edema ?Skin: No rashes, lesions or ulcers ?Psychiatry: Calm and cooperative, no agitation ? ? ? ?Data Reviewed: I have personally reviewed  labs and visualized  imaging studies since the last encounter and formulate the plan  ? ? ? ? ? ? ?Scheduled Meds: ? atorvastatin  10 mg Oral Daily  ?  bisacodyl  10 mg Rectal Daily  ? chlorhexidine  15 mL Mouth Rinse BID  ? Chlorhexidine Gluconate Cloth  6 each Topical Daily  ? enoxaparin (LOVENOX) injection  30 mg Subcutaneous Q24H  ? insulin aspart

## 2021-05-15 DIAGNOSIS — A419 Sepsis, unspecified organism: Secondary | ICD-10-CM | POA: Diagnosis not present

## 2021-05-15 LAB — CBC WITH DIFFERENTIAL/PLATELET
Abs Immature Granulocytes: 0.13 10*3/uL — ABNORMAL HIGH (ref 0.00–0.07)
Basophils Absolute: 0 10*3/uL (ref 0.0–0.1)
Basophils Relative: 0 %
Eosinophils Absolute: 0.1 10*3/uL (ref 0.0–0.5)
Eosinophils Relative: 1 %
HCT: 40.6 % (ref 39.0–52.0)
Hemoglobin: 13.2 g/dL (ref 13.0–17.0)
Immature Granulocytes: 1 %
Lymphocytes Relative: 5 %
Lymphs Abs: 0.8 10*3/uL (ref 0.7–4.0)
MCH: 28 pg (ref 26.0–34.0)
MCHC: 32.5 g/dL (ref 30.0–36.0)
MCV: 86 fL (ref 80.0–100.0)
Monocytes Absolute: 0.9 10*3/uL (ref 0.1–1.0)
Monocytes Relative: 7 %
Neutro Abs: 12.3 10*3/uL — ABNORMAL HIGH (ref 1.7–7.7)
Neutrophils Relative %: 86 %
Platelets: 250 10*3/uL (ref 150–400)
RBC: 4.72 MIL/uL (ref 4.22–5.81)
RDW: 15.3 % (ref 11.5–15.5)
WBC: 14.3 10*3/uL — ABNORMAL HIGH (ref 4.0–10.5)
nRBC: 0 % (ref 0.0–0.2)

## 2021-05-15 LAB — GLUCOSE, CAPILLARY
Glucose-Capillary: 152 mg/dL — ABNORMAL HIGH (ref 70–99)
Glucose-Capillary: 232 mg/dL — ABNORMAL HIGH (ref 70–99)
Glucose-Capillary: 181 mg/dL — ABNORMAL HIGH (ref 70–99)
Glucose-Capillary: 119 mg/dL — ABNORMAL HIGH (ref 70–99)

## 2021-05-15 LAB — BASIC METABOLIC PANEL
Anion gap: 15 (ref 5–15)
BUN: 55 mg/dL — ABNORMAL HIGH (ref 8–23)
CO2: 16 mmol/L — ABNORMAL LOW (ref 22–32)
Calcium: 8.8 mg/dL — ABNORMAL LOW (ref 8.9–10.3)
Chloride: 114 mmol/L — ABNORMAL HIGH (ref 98–111)
Creatinine, Ser: 1.25 mg/dL — ABNORMAL HIGH (ref 0.61–1.24)
GFR, Estimated: 59 mL/min — ABNORMAL LOW (ref >60)
Glucose, Bld: 193 mg/dL — ABNORMAL HIGH (ref 70–99)
Potassium: 3.8 mmol/L (ref 3.5–5.1)
Sodium: 145 mmol/L (ref 135–145)

## 2021-05-15 LAB — MAGNESIUM: Magnesium: 1.8 mg/dL (ref 1.7–2.4)

## 2021-05-15 MED ORDER — ENOXAPARIN SODIUM 40 MG/0.4ML IJ SOSY
40.0000 mg | PREFILLED_SYRINGE | INTRAMUSCULAR | Status: DC
  Administered 2021-05-15 – 2021-05-17 (×3): 40 mg via SUBCUTANEOUS
  Filled 2021-05-15 (×3): qty 0.4

## 2021-05-15 NOTE — Progress Notes (Signed)
Speech Language Pathology Treatment: Dysphagia  ?Patient Details ?Name: Mitchell Scott ?MRN: [id number] ?DOB: [birth year] ?Today's Date: 05/15/2021 ?Time: 0923-3007 ?SLP Time Calculation (min) (ACUTE ONLY): 10 min ? ?Assessment / Plan / Recommendation ?Clinical Impression ? ST follow up after BSE yesterday needing min cues to participate. Upgraded trial of Dys 3 given resulting in a munch chew mastication pattern, prolonged bolus formation and transit needing a liquid wash to clear. He had 2 delayed coughs after cracker possibly due to particles from Dys 3 texture. He tolerated pieces of graham cracker softened in applesauce with minimal mastication required. Straw sips water transited and swallowed briskly without incident. While in acute care it is recommended that he continue a Dys 2 texture (fine chopped) and at discharge. Recommend he have ST at facility to evaluate with higher texture for possible advancement. He needs assist with feeding and safe swallow precautions. ST will sign off at this time.  ?  ?HPI HPI: Mitchell Scott is a 81 y.o. male with medical history significant of type 2 diabetes, diabetic polyneuropathy, CAD, history MI, hyperlipidemia, hypertension, liver damage, subdural hematoma, COVID-19 who is sent from his facility due to altered mental status. Per chart he has been hyperglycemic in the 450. Portable chest radiograph with no active disease. Found to have acute metabolic encephalopathy from sepsis due to undetermined organism POA, acute kidney injury. BSE 06/2021 no concern for oropharyngeal dysphagia, regular texture, thin liquids and ST signed off ?  ?   ?SLP Plan ? Discharge SLP treatment due to (comment) ? ?  ?  ?Recommendations for follow up therapy are one component of a multi-disciplinary discharge planning process, led by the attending physician.  Recommendations may be updated based on patient status, additional functional criteria and insurance authorization. ?  ? ?Recommendations   ?Diet recommendations: Dysphagia 2 (fine chop);Thin liquid ?Liquids provided via: Cup;Straw ?Medication Administration: Whole meds with puree ?Supervision: Staff to assist with self feeding;Full supervision/cueing for compensatory strategies ?Compensations: Minimize environmental distractions;Slow rate;Small sips/bites;Lingual sweep for clearance of pocketing ?Postural Changes and/or Swallow Maneuvers: Seated upright 90 degrees  ?   ?    ?   ? ? ? ? Oral Care Recommendations: Oral care BID ?Follow Up Recommendations: No SLP follow up ?Assistance recommended at discharge: Frequent or constant Supervision/Assistance ?SLP Visit Diagnosis: Dysphagia, unspecified (R13.10) ?Plan: Discharge SLP treatment due to (comment) ? ? ? ? ?  ?  ? ? ?Mitchell Scott ? ?05/15/2021, 2:44 PM ?

## 2021-05-15 NOTE — Progress Notes (Addendum)
?PROGRESS NOTE ? ? ? ?Mitchell Scott  [id number] DOB: 11-24-1940 DOA: 05/13/2021 ?PCP: Raymondo Band, MD  ? ? ? ?Brief Narrative:  ? ?Mitchell Scott is a 81 y.o. male with medical history significant of type 2 diabetes, diabetic polyneuropathy, CAD, history MI, hyperlipidemia, hypertension, liver damage, subdural hematoma, COVID-19 who is sent from his facility due to altered mental status.  At the last time he was seen at baseline at the facility was 3 days ago.  The facility nursing staff stated that he has been hyperglycemic in the 450s and his activity level has decreased substantially.  His initial GCS was 8 in the emergency department.  He is now waking up but unable to provide any meaningful HPI. ? ?Subjective: ? ? ?C/o rectal pain, per RN documentation had bmx1 ?No fever, he is alert , interactive, but He is not a reliable historian, knows he is in [place], oriented to self, not oriented to time ?Attempt to follow commands, but overall very weak appearing ? ?He is on room air ? ?Assessment & Plan: ? Principal Problem: ?  Sepsis due to undetermined organism [facility]) ?Active Problems: ?  Hypertension ?  Hypercholesteremia ?  Acute metabolic encephalopathy ?  CAD (coronary artery disease) ?  Polyneuropathy due to type 2 diabetes mellitus ([place]) ?  AKI (acute kidney injury) ([place]) ?  Hypernatremia ?  Type 2 diabetes mellitus with hyperglycemia (HCC) ? ? ? ?Assessment and Plan: ? ? Acute metabolic encephalopathy /   from ecoli bacteremia  ?CT head no acute findings ?Was on  cefepime /  metronidazole, now on rocephin ?Follow-up blood culture and sensitivity ? ?  ? ?  AKI (acute kidney injury) (HCC)/acute urinary retention Foley insertion on 5/10 ?4 mm calculus within the right renal collecting system without hydronephrosis shown on CT abdomen pelvis ?Mild prostatomegaly with suggestion of bladder wall thickening,suggesting possible chronic bladder outlet obstruction. Consider ?correlation with urinalysis to  exclude cystitis ?Urine culture in process ?Hold metformin. ?Avoid hypotension ?Avoid nephrotoxins. ?Cr improving  ? ? ?  ?  Hypernatremiacorrected sodium is 163 mmol/L on admision ?Normalized after hydration . ? ?  ?  Type 2 diabetes mellitus with hyperglycemia ([place]), uncontrolled with hyperglycemia ?A1c 11.5  ?carbohydrate modified/renal diet. ?CBG monitoring with RI SS, start long-acting insulin, continue to titrate ?  ?  Hypercholesteremia ?Continue atorvastatin 10 mg p.o. daily. ?  ?  Hypertension ?Antihypertensives as needed. ?Monitor blood pressure. ?  ?  CAD (coronary artery disease) ?Aortic and coronary artery atherosclerosis seen on CT scan ?On atorvastatin. ?Not on ASA or beta-blockers. ?  ?  Polyneuropathy due to type 2 diabetes mellitus ([place]) ?Blood glucose control. ?Analgesics as needed. ?  ? The patient?s BMI is: Body mass index is 19.01 kg/m?Marland Kitchen. ? ?.  Constipation ?Large stool burden's in the rectum seen on CT scan ?Suppository and stool softener ? ?Moderately sized hiatal hernia ?Seen on CT scan ? ?Dysphagia ?Speech eval order dysphagia 2 diet, Brother said patient does better with Puree food, speech reeval requested  ?Per family patient also had history of esophageal stricture, required egd dilation in 10/2020, will  get DG esophagus ? ?H/o mental delay, FTT, progressive weight loss ?Baseline bed to wheelchair bound, needs assistance with most of his ADLs ? ?  ?Skin Assessment: ?I have examined the patient?s skin and I agree with the wound assessment as performed by the wound care RN as outlined below: ?Pressure Injury 05/13/21 Coccyx Medial Stage 2 -  Partial thickness loss of dermis  presenting as a shallow open injury with a red, pink wound bed without slough. unattached edges.  Small dime shape nectrotic issue at bottom (Active)  ?05/13/21 2000  ?Location: Coccyx  ?Location Orientation: Medial  ?Staging: Stage 2 -  Partial thickness loss of dermis presenting as a shallow open injury with a red,  pink wound bed without slough.  ?Wound Description (Comments): unattached edges.  Small dime shape nectrotic issue at bottom  ?Present on Admission: Yes  ?Dressing Type Foam - Lift dressing to assess site every shift 05/15/21 0201  ? ? ? ?I have Reviewed nursing notes, Vitals, pain scores, I/o's, Lab results and  imaging results since pt's last encounter, details please see discussion above  ?I ordered the following labs:  ?Unresulted Labs (From admission, onward)  ? ?  Start     Ordered  ? 05/20/21 0500  Creatinine, serum  (enoxaparin (LOVENOX)    CrCl < 30 ml/min)  Once,   R       ?Comments: while on enoxaparin therapy. ?  ? 05/13/21 1419  ? 05/15/21 [id number]  Basic metabolic panel  Daily,   R     ? 05/14/21 1913  ? 05/15/21 0500  Magnesium  Tomorrow morning,   R       ? 05/14/21 1913  ? ?  ?  ? ?  ? ? ? ?DVT prophylaxis: enoxaparin (LOVENOX) injection 30 mg Start: 05/13/21 2200 ? ? ?Code Status:   Code Status: DNR ? ?Family Communication: brother over the phone  ?Disposition:  ? ?Status is: Inpatient ? ? ?Dispo: The patient is from: SNF ?             Anticipated d/c is to: SNF ?             Anticipated d/c date is: awaiting for culture result, possible discharge in 48hrs, need voiding trial prior to discharge, ? ?Antimicrobials:   ? ?Anti-infectives (From admission, onward)  ? ? Start     Dose/Rate Route Frequency Ordered Stop  ? 05/15/21 1000  cefTRIAXone (ROCEPHIN) 2 g in sodium chloride 0.9 % 100 mL IVPB       ? 2 g ?200 mL/hr over 30 Minutes Intravenous Every 24 hours 05/14/21 0419    ? 05/15/21 0800  vancomycin (VANCOREADY) IVPB 750 mg/150 mL  Status:  Discontinued       ? 750 mg ?150 mL/hr over 60 Minutes Intravenous Every 48 hours 05/13/21 1512 05/14/21 0419  ? 05/14/21 1000  ceFEPIme (MAXIPIME) 2 g in sodium chloride 0.9 % 100 mL IVPB  Status:  Discontinued       ? 2 g ?200 mL/hr over 30 Minutes Intravenous Every 24 hours 05/13/21 1512 05/14/21 0419  ? 05/13/21 1400  metroNIDAZOLE (FLAGYL) IVPB 500 mg   Status:  Discontinued       ? 500 mg ?100 mL/hr over 60 Minutes Intravenous Every 12 hours 05/13/21 1359 05/14/21 0419  ? 05/13/21 1030  vancomycin (VANCOCIN) IVPB 1000 mg/200 mL premix       ? 1,000 mg ?200 mL/hr over 60 Minutes Intravenous  Once 05/13/21 1022 05/13/21 1335  ? 05/13/21 1030  ceFEPIme (MAXIPIME) 2 g in sodium chloride 0.9 % 100 mL IVPB       ? 2 g ?200 mL/hr over 30 Minutes Intravenous  Once 05/13/21 1022 05/13/21 1111  ? 05/13/21 1030  azithromycin (ZITHROMAX) 500 mg in sodium chloride 0.9 % 250 mL IVPB       ? 500 mg ?  250 mL/hr over 60 Minutes Intravenous  Once 05/13/21 1022 05/13/21 1229  ? ?  ? ? ? ? ? ?Objective: ?Vitals:  ? 05/14/21 1120 05/14/21 2205 05/15/21 0700 05/15/21 0947  ?BP: 103/64 118/88 120/84 137/68  ?Pulse: 64 71 72 84  ?Resp: 16 20 20 17   ?Temp:  98.1 ?F (36.7 ?C) 98.2 ?F (36.8 ?C) (!) 97.1 ?F (36.2 ?C)  ?TempSrc:  Oral Oral   ?SpO2: 100% 100% 100% 99%  ?Weight:   56.7 kg   ?Height:      ? ? ?Intake/Output Summary (Last 24 hours) at 05/15/2021 1052 ?Last data filed at 05/15/2021 1034 ?Gross per 24 hour  ?Intake 900 ml  ?Output 2425 ml  ?Net -1525 ml  ? ?Filed Weights  ? 05/13/21 1156 05/13/21 2011 05/15/21 0700  ?Weight: 59 kg 56.6 kg 56.7 kg  ? ? ?Examination: ? ?General exam: Alert, interactive, attempt to follow command, chronically ill-appearing, very weak, oriented to self, know his in the [place] ?Respiratory system: diminished, no wheezing, no rales, no rhonchi, Respiratory effort normal. ?Cardiovascular system:  RRR.  ?Gastrointestinal system: Abdomen is nondistended, soft and nontender.  Normal bowel sounds heard. ?Central nervous system: Alert and oriented. No focal neurological deficits. ?Extremities:  no edema ?Skin: No rashes, lesions or ulcers ?Psychiatry: Calm and cooperative, no agitation ? ? ? ?Data Reviewed: I have personally reviewed  labs and visualized  imaging studies since the last encounter and formulate the plan  ? ? ? ? ? ? ?Scheduled Meds: ?  atorvastatin  10 mg Oral Daily  ? chlorhexidine  15 mL Mouth Rinse BID  ? Chlorhexidine Gluconate Cloth  6 each Topical Daily  ? enoxaparin (LOVENOX) injection  30 mg Subcutaneous Q24H  ? insulin aspart  0-9 Units Subcut

## 2021-05-15 NOTE — Progress Notes (Signed)
Per nursing tech, patient chews food (mashed potatoes) but will not swallow.  When asked to swallow, patient says, "I can't." ? ?Per patient's brother, patient has had issues swallowing in the past. ? ?MD notified. ? ?Bradd Burner, RN  ?

## 2021-05-16 ENCOUNTER — Inpatient Hospital Stay (HOSPITAL_COMMUNITY): Payer: Medicare Other

## 2021-05-16 DIAGNOSIS — A419 Sepsis, unspecified organism: Secondary | ICD-10-CM | POA: Diagnosis not present

## 2021-05-16 DIAGNOSIS — L8990 Pressure ulcer of unspecified site, unspecified stage: Secondary | ICD-10-CM | POA: Insufficient documentation

## 2021-05-16 LAB — BASIC METABOLIC PANEL
Anion gap: 7 (ref 5–15)
BUN: 40 mg/dL — ABNORMAL HIGH (ref 8–23)
CO2: 22 mmol/L (ref 22–32)
Calcium: 8.1 mg/dL — ABNORMAL LOW (ref 8.9–10.3)
Chloride: 112 mmol/L — ABNORMAL HIGH (ref 98–111)
Creatinine, Ser: 1.03 mg/dL (ref 0.61–1.24)
GFR, Estimated: >60 mL/min (ref >60)
Glucose, Bld: 111 mg/dL — ABNORMAL HIGH (ref 70–99)
Potassium: 3.3 mmol/L — ABNORMAL LOW (ref 3.5–5.1)
Sodium: 141 mmol/L (ref 135–145)

## 2021-05-16 LAB — CULTURE, BLOOD (ROUTINE X 2): Special Requests: ADEQUATE

## 2021-05-16 LAB — GLUCOSE, CAPILLARY
Glucose-Capillary: 107 mg/dL — ABNORMAL HIGH (ref 70–99)
Glucose-Capillary: 170 mg/dL — ABNORMAL HIGH (ref 70–99)
Glucose-Capillary: 171 mg/dL — ABNORMAL HIGH (ref 70–99)
Glucose-Capillary: 245 mg/dL — ABNORMAL HIGH (ref 70–99)

## 2021-05-16 MED ORDER — TAMSULOSIN HCL 0.4 MG PO CAPS
0.8000 mg | ORAL_CAPSULE | Freq: Every evening | ORAL | Status: DC
  Administered 2021-05-17 – 2021-05-18 (×2): 0.8 mg via ORAL
  Filled 2021-05-16 (×2): qty 2

## 2021-05-16 MED ORDER — ZINC OXIDE 12.8 % EX OINT
TOPICAL_OINTMENT | Freq: Two times a day (BID) | CUTANEOUS | Status: DC
  Administered 2021-05-17: 1 via TOPICAL
  Filled 2021-05-16: qty 56.7

## 2021-05-16 MED ORDER — FOOD THICKENER (SIMPLYTHICK)
1.0000 | ORAL | Status: DC | PRN
Start: 2021-05-16 — End: 2021-05-18

## 2021-05-16 MED ORDER — CEFAZOLIN SODIUM-DEXTROSE 2-4 GM/100ML-% IV SOLN
2.0000 g | Freq: Three times a day (TID) | INTRAVENOUS | Status: AC
  Administered 2021-05-17 (×2): 2 g via INTRAVENOUS
  Filled 2021-05-16 (×3): qty 100

## 2021-05-16 NOTE — Care Management Important Message (Signed)
Important Message ? ?Patient Details IM Letter placed in Patients room. ?Name: Mitchell Scott ?MRN: [id number] ?Date of Birth: [birth year] ? ? ?Medicare Important Message Given:  Yes ? ? ? ? ?Caren Macadam ?05/16/2021, 11:57 AM ?

## 2021-05-16 NOTE — Evaluation (Signed)
Clinical/Bedside Swallow Evaluation ?Patient Details  ?Name: Mitchell Scott ?MRN: [id number] ?Date of Birth: 04-Aug-1940 ? ?Today's Date: 05/16/2021 ?Time: SLP Start Time (ACUTE ONLY): [id number] SLP Stop Time (ACUTE ONLY): 1600 ?SLP Time Calculation (min) (ACUTE ONLY): 15 min ? ?Past Medical History:  ?Past Medical History:  ?Diagnosis Date  ? Diabetes mellitus without complication ([place])   ? Heart attack ([place])   ? Hypercholesteremia   ? Hypertension   ? Liver damage   ? SDH (subdural hematoma) (HCC)   ? ?Past Surgical History:  ?Past Surgical History:  ?Procedure Laterality Date  ? BALLOON DILATION N/A 10/08/2020  ? Procedure: BALLOON DILATION;  Surgeon: Wilford Corner, MD;  Location: [facility];  Service: Endoscopy;  Laterality: N/A;  ? BIOPSY  10/08/2020  ? Procedure: BIOPSY;  Surgeon: Wilford Corner, MD;  Location: [facility];  Service: Endoscopy;;  ? ESOPHAGOGASTRODUODENOSCOPY (EGD) WITH PROPOFOL N/A 10/08/2020  ? Procedure: ESOPHAGOGASTRODUODENOSCOPY (EGD) WITH PROPOFOL;  Surgeon: Wilford Corner, MD;  Location: [facility];  Service: Endoscopy;  Laterality: N/A;  possible dilation  ? TONSILLECTOMY    ? ?HPI:  ?Mitchell Scott is a 81 y.o. male with medical history significant of type 2 diabetes, diabetic polyneuropathy, CAD, history MI, hyperlipidemia, hypertension, liver damage, subdural hematoma, COVID-19 who is sent from his facility due to altered mental status. Per chart he has been hyperglycemic in the 450. Portable chest radiograph with no active disease. Found to have acute metabolic encephalopathy from sepsis due to undetermined organism POA, acute kidney injury.  Pt underwent BSE a few days prior and dys2/thin diet was recommended.   RN had requested re-eval and pt underwent esophagram on 5/12 am with findings of dysmotility, hiatal hernia, and tablet halting at [place] for 3 minutes.  ?  ?Assessment / Plan / Recommendation  ?Clinical Impression ? Pt seen today for reevaluation of swallow function per  order *RN request.  Note pt is s/p esophagram with dysmotility observed as well as hiatal hernia and delayed clearance of barium tablet.  Pt reports hunger and thus was observed with Ensure x4 ounces, applesauce x4 ounces, icecream x3 ounces and thin water x3 ounces.  Did not test solids due to review of MD notes indicating pt's brother reports improved tolerance of pureed foods.  Pt with prolonged oral transiting and mastication with applesauce and icecream - suspect cognitive based.  Immediate cough with Ensure via straw- recurrently - and was prevented with use of cup.  PT with h/o silent aspiration of thin via cup/straw June 2022 and today is coughing with large cup boluses of thin.  Recommend dys1/nectar diet and follow up at SNF for dysphagia management/treatment with hopes to liberalize diet as able.  SLP attempted to call pt's brother twice without answer - left message with pt's permission.  Will follow up. ?SLP Visit Diagnosis: Dysphagia, unspecified (R13.10) ?   ?Aspiration Risk ? Mild aspiration risk  ?  ?Diet Recommendation Dysphagia 1 (Puree);Nectar-thick liquid  ? ?Liquid Administration via: Cup;No straw ?Medication Administration: Whole meds with puree ?Supervision: Staff to assist with self feeding;Full supervision/cueing for compensatory strategies ?Compensations: Minimize environmental distractions;Slow rate;Small sips/bites;Lingual sweep for clearance of pocketing ?Postural Changes: Seated upright at 90 degrees;Remain upright for at least 30 minutes after po intake  ?  ?Other  Recommendations Oral Care Recommendations: Oral care BID;Other (Comment) (oral suction after meals) ?Other Recommendations: Order thickener from pharmacy   ? ?Recommendations for follow up therapy are one component of a multi-disciplinary discharge planning process, led by the attending physician.  Recommendations  may be updated based on patient status, additional functional criteria and insurance authorization. ? ?Follow  up Recommendations No SLP follow up  ? ? ?  ?Assistance Recommended at Discharge Frequent or constant Supervision/Assistance  ?Functional Status Assessment Patient has had a recent decline in their functional status and demonstrates the ability to make significant improvements in function in a reasonable and predictable amount of time.  ?Frequency and Duration min 2x/week  ?2 weeks ?  ?   ? ?Prognosis Prognosis for Safe Diet Advancement: Fair ?Barriers to Reach Goals: Cognitive deficits  ? ?  ? ?Swallow Study   ?General HPI: Mitchell Scott is a 81 y.o. male with medical history significant of type 2 diabetes, diabetic polyneuropathy, CAD, history MI, hyperlipidemia, hypertension, liver damage, subdural hematoma, COVID-19 who is sent from his facility due to altered mental status. Per chart he has been hyperglycemic in the 450. Portable chest radiograph with no active disease. Found to have acute metabolic encephalopathy from sepsis due to undetermined organism POA, acute kidney injury.  Pt underwent BSE a few days prior and dys2/thin diet was recommended.   RN had requested re-eval and pt underwent esophagram on 5/12 am with findings of dysmotility, hiatal hernia, and tablet halting at [place] for 3 minutes. ?Type of Study: Bedside Swallow Evaluation ?Previous Swallow Assessment: prior MBS 06/2020 with recommendation for dys3/nectar and tsps thin ?Diet Prior to this Study: NPO ?Temperature Spikes Noted: No ?Respiratory Status: Room air ?History of Recent Intubation: No ?Behavior/Cognition: Alert;Cooperative;Pleasant mood;Requires cueing ?Oral Cavity Assessment: Within Functional Limits ?Oral Care Completed by SLP: No ?Oral Cavity - Dentition: Poor condition;Missing dentition (few dentition) ?Self-Feeding Abilities: Needs assist ?Patient Positioning: Upright in bed ?Baseline Vocal Quality: Normal ?Volitional Cough: Weak ?Volitional Swallow: Able to elicit  ?  ?Oral/Motor/Sensory Function Overall Oral Motor/Sensory  Function: Within functional limits   ?Ice Chips Ice chips: Not tested   ?Thin Liquid Thin Liquid: Impaired ?Presentation: Cup;Straw ?Pharyngeal  Phase Impairments: Wet Vocal Quality;Cough - Immediate  ?  ?Nectar Thick Nectar Thick Liquid: Impaired ?Presentation: Straw ?Pharyngeal Phase Impairments: Cough - Delayed ?Other Comments: use of cup prevented indication of airway compromise   ?Honey Thick Honey Thick Liquid: Not tested   ?Puree Puree: Impaired ?Presentation: Spoon ?Oral Phase Functional Implications: Prolonged oral transit;Other (comment) ?Other Comments: oral retention of secretions noted with pt benefiting from assist to use oral suction to clear   ?Solid ? ? ?  Solid: Not tested (due to concern for dysphagia with solids)  ? ?  ? ?Macario Golds ?05/16/2021,5:08 PM ? ?Kathleen Lime, MS CCC SLP ?Acute Rehab Services ?Office [phone] ?Pager [phone] ? ? ?

## 2021-05-16 NOTE — Progress Notes (Addendum)
?PROGRESS NOTE ? ? ? ?Mitchell Scott  [id number] DOB: [birth year] DOA: 05/13/2021 ?PCP: Raymondo Band, MD  ? ? ? ?Brief Narrative:  ? ?Mitchell Scott is a 81 y.o. male with medical history significant of type 2 diabetes, diabetic polyneuropathy, CAD, history MI, hyperlipidemia, hypertension, liver damage, subdural hematoma, COVID-19 who is sent from his facility due to altered mental status.  At the last time he was seen at baseline at the facility was 3 days ago.  The facility nursing staff stated that he has been hyperglycemic in the 450s and his activity level has decreased substantially.  His initial GCS was 8 in the emergency department.  He is now waking up but unable to provide any meaningful HPI. ? ?Subjective: ? ? ?No significant interval changes ?he is on room air  ?very poor oral intake, continue to be very weak ?Foley in place ? ?Assessment & Plan: ? Principal Problem: ?  Sepsis due to undetermined organism [facility]) ?Active Problems: ?  Hypertension ?  Hypercholesteremia ?  Acute metabolic encephalopathy ?  CAD (coronary artery disease) ?  Polyneuropathy due to type 2 diabetes mellitus ([place]) ?  AKI (acute kidney injury) ([employer]) ?  Hypernatremia ?  Type 2 diabetes mellitus with hyperglycemia (HCC) ?  Pressure injury of skin ? ? ? ?Assessment and Plan: ? ? Acute metabolic encephalopathy /   from ecoli bacteremia  ?CT head no acute findings ?Blood culture positive for E. coli resistant to Cipro gentamicin and Bactrim, sensitive to rest antibiotic tested ?Was on  cefepime /  metronidazole then  rocephin , now cefazolin ? ?  ? ?  AKI (acute kidney injury) (HCC)/acute urinary retention Foley insertion on 5/10 ?4 mm calculus within the right renal collecting system without hydronephrosis shown on CT abdomen pelvis ?Mild prostatomegaly with suggestion of bladder wall thickening,suggesting possible chronic bladder outlet obstruction. Consider ?correlation with urinalysis to exclude cystitis ?Hold metformin. ?Avoid  hypotension ?Avoid nephrotoxins. ?Cr improving  ?Increase Flomax, plan for voiding trial prior to discharge currently appear very weak ? ? ?  ?  Hypernatremiacorrected sodium is 163 mmol/L on admision ?Normalized after hydration .  Continue to be at risk of hypernatremia due to poor oral intake ?He has dysphagia however he does not like thickened liquid, difficult to follow instruction due to mental debility ? ?  ?  Type 2 diabetes mellitus with hyperglycemia ([place]), uncontrolled with hyperglycemia ?A1c 11.5  ?carbohydrate modified/renal diet. ?CBG monitoring with RI SS, start long-acting insulin, continue to titrate ?  ?  Hypercholesteremia ?Continue atorvastatin 10 mg p.o. daily. ?  ?  Hypertension ?Antihypertensives as needed. ?Monitor blood pressure. ?  ?  CAD (coronary artery disease) ?Aortic and coronary artery atherosclerosis seen on CT scan ?On atorvastatin. ?Not on ASA or beta-blockers. ?  ?  Polyneuropathy due to type 2 diabetes mellitus ([place]) ?Blood glucose control. ?Analgesics as needed. ?  ? The patient?s BMI is: Body mass index is 19.01 kg/m?Marland Kitchen. ? ?.  Constipation ?Large stool burden's in the rectum seen on CT scan ?Suppository and stool softener ? ?Moderately sized hiatal hernia ?Seen on CT scan ? 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 ? ?H/o mental delay, FTT, progressive weight loss ?  Baseline bed to wheelchair bound, needs assistance with most of his ADLs ? ?  ?Skin Assessment: ?I have examined the patient?s skin and I agree with the wound assessment as performed by the wound care RN as outlined below: ?Pressure  Injury 05/13/21 Coccyx Medial Stage 2 -  Partial thickness loss of dermis presenting as a shallow open injury with a red, pink wound bed without slough. unattached edges.  Small dime shape nectrotic issue at bottom (Active)  ?05/13/21 2000  ?Location: Coccyx  ?Location Orientation: Medial  ?Staging: Stage 2 -  Partial thickness loss of dermis presenting as a shallow open injury with a red, pink wound bed without slough.  ?Wound Description (Comments): unattached edges.  Small dime shape nectrotic issue at bottom  ?Present on Admission: Yes  ?Dressing Type None 05/16/21 1110  ? ?Apply Triple Paste to buttocks and periarea after each incontinent cleaning and at least twice daily. ? ?I have Reviewed nursing notes, Vitals, pain scores, I/o's, Lab results and  imaging results since pt's last encounter, details please see discussion above  ?I ordered the following labs:  ?Unresulted Labs (From admission, onward)  ? ?  Start     Ordered  ? 05/20/21 0500  Creatinine, serum  (enoxaparin (LOVENOX)    CrCl < 30 ml/min)  Once,   R       ?Comments: while on enoxaparin therapy. ?  ? 05/13/21 1419  ? 05/17/21 0500  CBC with Differential/Platelet  Tomorrow morning,   R       ?Question:  Specimen collection method  Answer:  Lab=Lab collect  ? 05/16/21 2143  ? 05/17/21 0500  Magnesium  Tomorrow morning,   R       ?Question:  Specimen collection method  Answer:  Lab=Lab collect  ? 05/16/21 2143  ? 05/15/21 [id number]  Basic metabolic panel  Daily,   R     ? 05/14/21 1913  ? ?  ?  ? ?  ? ? ? ?DVT prophylaxis: enoxaparin (LOVENOX) injection 40 mg Start: 05/15/21 2200 ? ? ?Code Status:   Code Status: DNR ? ?Family Communication: brother over the phone on 5/11 ?Disposition:  ? ?Status is: Inpatient ? ? ?Dispo: The patient is from: SNF ?             Anticipated d/c is to: SNF ?             Anticipated d/c date is: awaiting for culture result, possible discharge in 24-48hrs, need voiding trial prior to discharge, ? ?Antimicrobials:    ? ?Anti-infectives (From admission, onward)  ? ? Start     Dose/Rate Route Frequency Ordered Stop  ? 05/17/21 1200  ceFAZolin (ANCEF) IVPB 2g/100 mL premix       ? 2 g ?200 mL/hr over 30 Minutes Intravenous Every 8 hours 05/16/21 1416    ? 05/15/21 1000  cefTRIAXone (ROCEPHIN) 2 g in sodium chloride 0.9 % 100 mL IVPB  Status:  Discontinued       ? 2 g ?200 mL/hr over 30 Minutes Intravenous Every 24 hours 05/14/21 0419 05/16/21 1416  ? 05/15/21 0800  vancomycin (VANCOREADY) IVPB 750 mg/150 mL  Status:  Discontinued       ? 750 mg ?150 mL/hr over 60 Minutes Intravenous Every 48 hours 05/13/21 1512 05/14/21 0419  ? 05/14/21 1000  ceFEPIme (MAXIPIME) 2 g in sodium chloride 0.9 % 100 mL IVPB  Status:  Discontinued       ? 2 g ?200 mL/hr  over 30 Minutes Intravenous Every 24 hours 05/13/21 1512 05/14/21 0419  ? 05/13/21 1400  metroNIDAZOLE (FLAGYL) IVPB 500 mg  Status:  Discontinued       ? 500 mg ?100 mL/hr over 60 Minutes Intravenous Every 12 hours 05/13/21 1359 05/14/21 0419  ? 05/13/21 1030  vancomycin (VANCOCIN) IVPB 1000 mg/200 mL premix       ? 1,000 mg ?200 mL/hr over 60 Minutes Intravenous  Once 05/13/21 1022 05/13/21 1335  ? 05/13/21 1030  ceFEPIme (MAXIPIME) 2 g in sodium chloride 0.9 % 100 mL IVPB       ? 2 g ?200 mL/hr over 30 Minutes Intravenous  Once 05/13/21 1022 05/13/21 1111  ? 05/13/21 1030  azithromycin (ZITHROMAX) 500 mg in sodium chloride 0.9 % 250 mL IVPB       ? 500 mg ?250 mL/hr over 60 Minutes Intravenous  Once 05/13/21 1022 05/13/21 1229  ? ?  ? ? ? ? ? ?Objective: ?Vitals:  ? 05/15/21 2229 05/16/21 0701 05/16/21 1212 05/16/21 2035  ?BP: 100/86 117/62 106/69 133/64  ?Pulse: 72 73 73 73  ?Resp: 20 20 16 16   ?Temp: 98 ?F (36.7 ?C) 98 ?F (36.7 ?C) 97.6 ?F (36.4 ?C) 97.6 ?F (36.4 ?C)  ?TempSrc:    Axillary  ?SpO2: 99% 100% 100% 99%  ?Weight:      ?Height:      ? ? ?Intake/Output Summary (Last 24 hours) at 05/16/2021 2152 ?Last data filed at 05/16/2021 1700 ?Gross per 24 hour  ?Intake 460 ml  ?Output  2350 ml  ?Net -1890 ml  ? ?Filed Weights  ? 05/13/21 1156 05/13/21 2011 05/15/21 0700  ?Weight: 59 kg 56.6 kg 56.7 kg  ? ? ?Examination: ? ?General exam: Alert, interactive, attempt to follow command, chronically i

## 2021-05-16 NOTE — Consult Note (Signed)
WOC Nurse Consult Note: ?Patient receiving care in [facility] 1439. NT assisting with turning patient. 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 ?Drainage (amount, consistency, odor) none ?Periwound: intact ?Dressing procedure/placement/frequency: ?Apply Triple Paste to buttocks and periarea after each incontinent cleaning and at least twice daily. ?I have also added turning orders and to avoid pressure to the sacrum as much as possible. ? ?Monitor the wound area(s) for worsening of condition such as: ?Signs/symptoms of infection,  ?Increase in size,  ?Development of or worsening of odor, ?Development of pain, or increased pain at the affected locations.  Notify the medical team if any of these develop. ? ?Thank you for the consult.  Discussed plan of care with the patient and bedside nurse.  WOC nurse will not follow at this time.  Please re-consult the WOC team if needed. ? ?Helmut Muster, RN, MSN, CWOCN, CNS-BC, pager [phone]  ?  ?

## 2021-05-16 NOTE — TOC Progression Note (Addendum)
Transition of Care (TOC) - Progression Note  ? ? ?Patient Details  ?Name: Mitchell Scott ?MRN: [id number] ?Date of Birth: 01/16/1940 ? ?Transition of Care (TOC) CM/SW Contact  ?Geni Bers, RN ?Phone Number: ?05/16/2021, 12:02 PM ? ?Clinical Narrative:    ?Pt is from [facility], plan to return at discharge.  ? ? ?Expected Discharge Plan: Skilled Nursing Facility ?Barriers to Discharge: No Barriers Identified ? ?Expected Discharge Plan and Services ?Expected Discharge Plan: Skilled Nursing Facility ?  ?  ?  ?Living arrangements for the past 2 months: Skilled Nursing Facility ?                ?  ?  ?  ?  ?  ?  ?  ?  ?  ?  ? ? ?Social Determinants of Health (SDOH) Interventions ?  ? ?Readmission Risk Interventions ?   ? View : No data to display.  ?  ?  ?  ? ? ?

## 2021-05-16 NOTE — Progress Notes (Addendum)
Speech Language Pathology Treatment: Dysphagia  ?Patient Details ?Name: Mitchell Scott ?MRN: [id number] ?DOB: 1940-06-25 ?Today's Date: 05/16/2021 ?Time: [id number] ?SLP Time Calculation (min) (ACUTE ONLY): 20 min ? ?Assessment / Plan / Recommendation ?Clinical Impression ? SLP second session for dysphagia management - providing Provale cup for safe consumption of thin liquids.  Provale cup releases 5 cc of liquid each time cup is elevated and must be returned to baseline position for resevoir to refill, maximizing his airway protection.  Pt demonstrated excellent use/tolerance of this cup without any indication of airway compromise.  Javarian is impulsive and takes large sips, thus recom.  SLP also received return call from pt's brother, Berneta Sages.  Berneta Sages expressed concern re: length of time pt needed to eat scrambled eggs yesterday - Oral pocketing of food noted even after completion of meal per Berneta Sages. At this time, agree with puree/nectar diet and follow up with SLP at facility to help maximize swallow rehabilitation/potential to advance diet.  Reviewed use of Provale cup with pt's brother, to which he agreed.    ? ?Pt takes very large boluses and thus aspiration risk is elevated - Provale cup will help to compensate for control issues and hopefully will maximize hydration.  ? ? ?Informed Berneta Sages that Ed states he will not drink the thickened drinks- and introduced risks/benefits of thickened liquids.  Hopefully use of Provale cup will help maximize pt liquid intake.   ? ?Tod [place] to speak to pt's PCP if hydration/nutrition becomes concern and is due to pt's dietary restrictions. Pt's brother expressed agreement with plan of care ? ?  ?HPI HPI: Mitchell Scott is a 81 y.o. male with medical history significant of type 2 diabetes, diabetic polyneuropathy, CAD, history MI, hyperlipidemia, hypertension, liver damage, subdural hematoma, COVID-19 who is sent from his facility due to altered mental status. Per chart he has  been hyperglycemic in the 450. Portable chest radiograph with no active disease. Found to have acute metabolic encephalopathy from sepsis due to undetermined organism POA, acute kidney injury.  Pt underwent BSE a few days prior and dys2/thin diet was recommended.   RN had requested re-eval and pt underwent esophagram on 5/12 am with findings of dysmotility, hiatal hernia, and tablet halting at [place] for 3 minutes. ?  ?   ?SLP Plan ? Continue with current plan of care ? ?  ?  ?Recommendations for follow up therapy are one component of a multi-disciplinary discharge planning process, led by the attending physician.  Recommendations may be updated based on patient status, additional functional criteria and insurance authorization. ?  ? ?Recommendations  ?Diet recommendations: Dysphagia 1 (puree);Nectar-thick liquid;Thin liquid (thin ok via provale cup) ?Liquids provided via: No straw;Cup ?Medication Administration: Whole meds with puree ?Supervision: Staff to assist with self feeding;Full supervision/cueing for compensatory strategies ?Compensations: Minimize environmental distractions;Slow rate;Small sips/bites;Lingual sweep for clearance of pocketing (oral suction after meals if indicated) ?Postural Changes and/or Swallow Maneuvers: Seated upright 90 degrees;Upright 30-60 min after meal  ?   ?    ?   ? ? ? ?Kathleen Lime, MS CCC SLP ?Acute Rehab Services ?Office [phone] ?Pager [phone] ? Oral Care Recommendations: Oral care BID;Other (Comment) ?Follow Up Recommendations: Skilled nursing-short term rehab (<3 hours/day) ?Assistance recommended at discharge: Frequent or constant Supervision/Assistance ?SLP Visit Diagnosis: Dysphagia, unspecified (R13.10) ?Plan: Continue with current plan of care ? ? ? ? ?  ?  ? ? ?Macario Golds ? ?05/16/2021, 5:21 PM ?

## 2021-05-17 DIAGNOSIS — A419 Sepsis, unspecified organism: Secondary | ICD-10-CM | POA: Diagnosis not present

## 2021-05-17 LAB — CBC WITH DIFFERENTIAL/PLATELET
Abs Immature Granulocytes: 0.18 10*3/uL — ABNORMAL HIGH (ref 0.00–0.07)
Basophils Absolute: 0 10*3/uL (ref 0.0–0.1)
Basophils Relative: 0 %
Eosinophils Absolute: 0.2 10*3/uL (ref 0.0–0.5)
Eosinophils Relative: 2 %
HCT: 37.5 % — ABNORMAL LOW (ref 39.0–52.0)
Hemoglobin: 12.1 g/dL — ABNORMAL LOW (ref 13.0–17.0)
Immature Granulocytes: 2 %
Lymphocytes Relative: 9 %
Lymphs Abs: 0.8 10*3/uL (ref 0.7–4.0)
MCH: 28.2 pg (ref 26.0–34.0)
MCHC: 32.3 g/dL (ref 30.0–36.0)
MCV: 87.4 fL (ref 80.0–100.0)
Monocytes Absolute: 0.8 10*3/uL (ref 0.1–1.0)
Monocytes Relative: 9 %
Neutro Abs: 7 10*3/uL (ref 1.7–7.7)
Neutrophils Relative %: 78 %
Platelets: 260 10*3/uL (ref 150–400)
RBC: 4.29 MIL/uL (ref 4.22–5.81)
RDW: 15.5 % (ref 11.5–15.5)
WBC: 8.9 10*3/uL (ref 4.0–10.5)
nRBC: 0 % (ref 0.0–0.2)

## 2021-05-17 LAB — BASIC METABOLIC PANEL
Anion gap: 10 (ref 5–15)
BUN: 26 mg/dL — ABNORMAL HIGH (ref 8–23)
CO2: 21 mmol/L — ABNORMAL LOW (ref 22–32)
Calcium: 8.2 mg/dL — ABNORMAL LOW (ref 8.9–10.3)
Chloride: 111 mmol/L (ref 98–111)
Creatinine, Ser: 0.95 mg/dL (ref 0.61–1.24)
GFR, Estimated: >60 mL/min (ref >60)
Glucose, Bld: 223 mg/dL — ABNORMAL HIGH (ref 70–99)
Potassium: 3.6 mmol/L (ref 3.5–5.1)
Sodium: 142 mmol/L (ref 135–145)

## 2021-05-17 LAB — GLUCOSE, CAPILLARY
Glucose-Capillary: 215 mg/dL — ABNORMAL HIGH (ref 70–99)
Glucose-Capillary: 217 mg/dL — ABNORMAL HIGH (ref 70–99)
Glucose-Capillary: 205 mg/dL — ABNORMAL HIGH (ref 70–99)
Glucose-Capillary: 151 mg/dL — ABNORMAL HIGH (ref 70–99)

## 2021-05-17 LAB — MAGNESIUM: Magnesium: 1.6 mg/dL — ABNORMAL LOW (ref 1.7–2.4)

## 2021-05-17 MED ORDER — MAGNESIUM SULFATE 2 GM/50ML IV SOLN
2.0000 g | Freq: Once | INTRAVENOUS | Status: AC
  Administered 2021-05-17: 2 g via INTRAVENOUS
  Filled 2021-05-17: qty 50

## 2021-05-17 MED ORDER — BISACODYL 10 MG RE SUPP
10.0000 mg | Freq: Every day | RECTAL | Status: AC
Start: 2021-05-17 — End: 2021-05-18
  Administered 2021-05-17 – 2021-05-18 (×2): 10 mg via RECTAL
  Filled 2021-05-17 (×2): qty 1

## 2021-05-17 MED ORDER — CEPHALEXIN 250 MG/5ML PO SUSR
500.0000 mg | Freq: Four times a day (QID) | ORAL | Status: DC
  Administered 2021-05-18 (×3): 500 mg via ORAL
  Filled 2021-05-17 (×3): qty 10

## 2021-05-17 NOTE — Progress Notes (Signed)
?PROGRESS NOTE ? ? ? ?Mitchell Scott  [id number] DOB: 06/19/1940 DOA: 05/13/2021 ?PCP: Raymondo Band, MD  ? ? ? ?Brief Narrative:  ? ?Mitchell Scott is a 81 y.o. male with medical history significant of type 2 diabetes, diabetic polyneuropathy, CAD, history MI, hyperlipidemia, hypertension, liver damage, subdural hematoma, COVID-19 who is sent from his facility due to altered mental status.  At the last time he was seen at baseline at the facility was 3 days ago.  The facility nursing staff stated that he has been hyperglycemic in the 450s and his activity level has decreased substantially.  His initial GCS was 8 in the emergency department.  He is now waking up but unable to provide any meaningful HPI. ? ?Subjective: ? ? ?No significant interval changes ?he is on room air  ?very poor oral intake, continue to be very weak ?Foley in place ?Not able to provide history ? ?Assessment & Plan: ? Principal Problem: ?  Sepsis due to undetermined organism [facility]) ?Active Problems: ?  Hypertension ?  Hypercholesteremia ?  Acute metabolic encephalopathy ?  CAD (coronary artery disease) ?  Polyneuropathy due to type 2 diabetes mellitus ([place]) ?  AKI (acute kidney injury) ([place]) ?  Hypernatremia ?  Type 2 diabetes mellitus with hyperglycemia (HCC) ?  Pressure injury of skin ? ? ? ?Assessment and Plan: ? ?Acute metabolic encephalopathy / from ecoli bacteremia  ?CT head no acute findings ?Blood and urine culture positive for E. coli resistant to Cipro gentamicin and Bactrim, sensitive to rest antibiotic tested ?Was on  cefepime /  metronidazole then  rocephin , now cefazolin, change to oral abx that can be crushed , appreciate pharmacy assistance  ? ?  ? ?AKI (acute kidney injury) (HCC)/acute urinary retention Foley insertion on 5/10 ?-4 mm calculus within the right renal collecting system without hydronephrosis shown on CT abdomen pelvis ?Mild prostatomegaly with suggestion of bladder wall thickening,suggesting possible chronic  bladder outlet obstruction. Consider ?correlation with urinalysis to exclude cystitis ?Hold metformin. ?Avoid hypotension ?Avoid nephrotoxins. ?Cr now normal ?Increase Flomax, plan for voiding trial prior to discharge currently appear very weak, may need to discharge with foley and urology outpatient follow up ? ? ?  ? Hypernatremia ?corrected sodium is 163 mmol/L on admision ?Normalized after hydration .  Continue to be at risk of hypernatremia due to poor oral intake ?He has dysphagia however he does not like thickened liquid, difficult to follow instruction due to mental debility ? ?Hypokalemia/hypomagnesemia ?Replace, recheck in the morning. ? ?  ? Type 2 diabetes mellitus with hyperglycemia ([place]), uncontrolled with hyperglycemia ?A1c 11.5  ?carbohydrate modified/renal diet. ?CBG monitoring with RI SS, start long-acting insulin, continue to titrate ?  ?  Hypercholesteremia ?Continue atorvastatin 10 mg p.o. daily. ?  ?  Hypertension ?Antihypertensives as needed. ?Monitor blood pressure. ?  ?  CAD (coronary artery disease) ?Aortic and coronary artery atherosclerosis seen on CT scan ?On atorvastatin. ?Not on ASA or beta-blockers. ?  ?  Polyneuropathy due to type 2 diabetes mellitus ([place]) ?Blood glucose control. ?Analgesics as needed. ?  ? The patient?s BMI is: Body mass index is 19.01 kg/m?Marland Kitchen. ? ?Marland KitchenConstipation ?Large stool burden's in the rectum seen on CT scan ?Suppository and stool softener ? ?Moderately sized hiatal hernia ?Seen on CT scan ? 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 ? ?H/o mental delay, FTT, progressive weight  loss ?Baseline bed to wheelchair bound, needs assistance with most of his ADLs ?Recommend palliative care to follow patient at snf, brother understand and agrees ? ?  ?Skin Assessment: ?I have examined the patient?s skin and I agree with the wound assessment as performed by the wound care RN as outlined below: ?Pressure Injury 05/13/21 Coccyx Medial Stage 2 -  Partial thickness loss of dermis presenting as a shallow open injury with a red, pink wound bed without slough. unattached edges.  Small dime shape nectrotic issue at bottom (Active)  ?05/13/21 2000  ?Location: Coccyx  ?Location Orientation: Medial  ?Staging: Stage 2 -  Partial thickness loss of dermis presenting as a shallow open injury with a red, pink wound bed without slough.  ?Wound Description (Comments): unattached edges.  Small dime shape nectrotic issue at bottom  ?Present on Admission: Yes  ?Dressing Type None 05/17/21 0846  ? ?Apply Triple Paste to buttocks and periarea after each incontinent cleaning and at least twice daily. ? ?I have Reviewed nursing notes, Vitals, pain scores, I/o's, Lab results and  imaging results since pt's last encounter, details please see discussion above  ?I ordered the following labs:  ?Unresulted Labs (From admission, onward)  ? ?  Start     Ordered  ? 05/20/21 0500  Creatinine, serum  (enoxaparin (LOVENOX)    CrCl < 30 ml/min)  Once,   R       ?Comments: while on enoxaparin therapy. ?  ? 05/13/21 1419  ? 05/18/21 [id number]  Basic metabolic panel  Tomorrow morning,   R       ?Question:  Specimen collection method  Answer:  Lab=Lab collect  ? 05/17/21 1606  ? 05/18/21 0500  Magnesium  Tomorrow morning,   R       ?Question:  Specimen collection method  Answer:  Lab=Lab collect  ? 05/17/21 1606  ? ?  ?  ? ?  ? ? ? ?DVT prophylaxis: enoxaparin (LOVENOX) injection 40 mg Start: 05/15/21 2200 ? ? ?Code Status:   Code Status: DNR ? ?Family Communication: brother over the phone on 5/11 and 5/13 ?Disposition:  ? ?Status is:  Inpatient ? ? ?Dispo: The patient is from: SNF ?             Anticipated d/c is to: SNF with palliative care ?             Anticipated d/c date is: possible discharge in 24-48hrs, need voiding trial prior to discharge, may need to discharge on foley and urology outpatient follow up ? ?Antimicrobials:   ? ?Anti-infectives (From admission, onward)  ? ? Start     Dose/Rate Route Frequency Ordered Stop  ? 05/17/21 1200  ceFAZolin (ANCEF) IVPB 2g/100 mL premix       ? 2 g ?200 mL/hr over 30 Minutes Intravenous Every 8 hours 05/16/21 1416    ? 05/15/21 1000  cefTRIAXone (ROCEPHIN) 2 g in sodium chloride 0.9 % 100 mL IVPB  Status:  Discontinued       ? 2 g ?200 mL/hr over 30 Minutes Intravenous Every 24 hours 05/14/21 0419 05/16/21 1416  ? 05/15/21 0800  vancomycin (VANCOREADY) IVPB 750 mg/150 mL  Status:  Discontinued       ? 750 mg ?150 mL/hr over 60 Minutes Intravenous Every 48 hours 05/13/21 1512  05/14/21 0419  ? 05/14/21 1000  ceFEPIme (MAXIPIME) 2 g in sodium chloride 0.9 % 100 mL IVPB  Status:  Discontinued       ? 2 g ?200 mL/hr over 30 Minutes Intravenous Every 24 hours 05/13/21 1512 05/14/21 0419  ? 05/13/21 1400  metroNIDAZOLE (FLAGYL) IVPB 500 mg  Status:  Discontinued       ? 500 mg ?100 mL/hr over 60 Minutes Intravenous Every 12 hours 05/13/21 1359 05/14/21 0419  ? 05/13/21 1030  vancomycin (VANCOCIN) IVPB 1000 mg/200 mL premix       ? 1,000 mg ?200 mL/hr over 60 Minutes Intravenous  Once 05/13/21 1022 05/13/21 1335  ? 05/13/21 1030  ceFEPIme (MAXIPIME) 2 g in sodium chloride 0.9 % 100 mL IVPB       ? 2 g ?200 mL/hr over 30 Minutes Intravenous  Once 05/13/21 1022 05/13/21 1111  ? 05/13/21 1030  azithromycin (ZITHROMAX) 500 mg in sodium chloride 0.9 % 250 mL IVPB       ? 500 mg ?250 mL/hr over 60 Minutes Intravenous  Once 05/13/21 1022 05/13/21 1229  ? ?  ? ? ? ? ? ?Objective: ?Vitals:  ? 05/16/21 1212 05/16/21 2035 05/17/21 0612 05/17/21 1321  ?BP: 106/69 133/64 132/77 123/67  ?Pulse: 73 73 79 78  ?Resp: 16  16 17 14   ?Temp: 97.6 ?F (36.4 ?C) 97.6 ?F (36.4 ?C) 97.9 ?F (36.6 ?C) (!) 97.5 ?F (36.4 ?C)  ?TempSrc:  Axillary  Axillary  ?SpO2: 100% 99% 100% 100%  ?Weight:      ?Height:      ? ? ?Intake/Output Summary (Last

## 2021-05-18 DIAGNOSIS — A419 Sepsis, unspecified organism: Secondary | ICD-10-CM | POA: Diagnosis not present

## 2021-05-18 LAB — URINE CULTURE: Culture: 10000 — AB

## 2021-05-18 LAB — BASIC METABOLIC PANEL
Anion gap: 6 (ref 5–15)
BUN: 23 mg/dL (ref 8–23)
CO2: 22 mmol/L (ref 22–32)
Calcium: 7.9 mg/dL — ABNORMAL LOW (ref 8.9–10.3)
Chloride: 114 mmol/L — ABNORMAL HIGH (ref 98–111)
Creatinine, Ser: 0.87 mg/dL (ref 0.61–1.24)
GFR, Estimated: >60 mL/min (ref >60)
Glucose, Bld: 191 mg/dL — ABNORMAL HIGH (ref 70–99)
Potassium: 3.4 mmol/L — ABNORMAL LOW (ref 3.5–5.1)
Sodium: 142 mmol/L (ref 135–145)

## 2021-05-18 LAB — CULTURE, BLOOD (ROUTINE X 2): Special Requests: ADEQUATE

## 2021-05-18 LAB — GLUCOSE, CAPILLARY
Glucose-Capillary: 198 mg/dL — ABNORMAL HIGH (ref 70–99)
Glucose-Capillary: 246 mg/dL — ABNORMAL HIGH (ref 70–99)
Glucose-Capillary: 186 mg/dL — ABNORMAL HIGH (ref 70–99)

## 2021-05-18 LAB — MAGNESIUM: Magnesium: 2.1 mg/dL (ref 1.7–2.4)

## 2021-05-18 MED ORDER — CEPHALEXIN 250 MG/5ML PO SUSR: 500.0000 mg | Freq: Three times a day (TID) | ORAL | 0 refills | Status: AC

## 2021-05-18 MED ORDER — FINASTERIDE 5 MG PO TABS: 5.0000 mg | ORAL_TABLET | Freq: Every day | ORAL | 2 refills | Status: AC

## 2021-05-18 MED ORDER — SENNOSIDES-DOCUSATE SODIUM 8.6-50 MG PO TABS: 1.0000 | ORAL_TABLET | Freq: Two times a day (BID) | ORAL | Status: AC

## 2021-05-18 MED ORDER — POLYETHYLENE GLYCOL 3350 17 G PO PACK: 17.0000 g | PACK | Freq: Two times a day (BID) | ORAL | 0 refills | Status: AC

## 2021-05-18 MED ORDER — POTASSIUM CHLORIDE 20 MEQ PO PACK: 40.0000 meq | PACK | ORAL | 0 refills | Status: AC

## 2021-05-18 MED ORDER — FOOD THICKENER (SIMPLYTHICK): 1.0000 | ORAL | Status: AC | PRN

## 2021-05-18 MED ORDER — ZINC OXIDE 12.8 % EX OINT: TOPICAL_OINTMENT | Freq: Two times a day (BID) | CUTANEOUS | 0 refills | Status: DC

## 2021-05-18 MED ORDER — ORAL CARE MOUTH RINSE: 15.0000 mL | Freq: Two times a day (BID) | OROMUCOSAL | 0 refills | Status: AC

## 2021-05-18 MED ORDER — TAMSULOSIN HCL 0.4 MG PO CAPS: 0.8000 mg | ORAL_CAPSULE | Freq: Every day | ORAL | 0 refills | Status: AC

## 2021-05-18 MED ORDER — POTASSIUM CHLORIDE 20 MEQ PO PACK
40.0000 meq | PACK | Freq: Once | ORAL | Status: AC
Start: 2021-05-18 — End: 2021-05-18
  Administered 2021-05-18: 40 meq via ORAL
  Filled 2021-05-18: qty 2

## 2021-05-18 MED ORDER — ZINC OXIDE 12.8 % EX OINT
TOPICAL_OINTMENT | Freq: Two times a day (BID) | CUTANEOUS | 0 refills | Status: AC
Start: 2021-05-18 — End: ?

## 2021-05-18 MED ORDER — CHLORHEXIDINE GLUCONATE 0.12 % MT SOLN: 15.0000 mL | Freq: Two times a day (BID) | OROMUCOSAL | 0 refills | Status: AC

## 2021-05-18 NOTE — Discharge Summary (Signed)
?Discharge Summary ? ?Mitchell Scott [id number] DOB: 1940-04-16 ? ?PCP: Raymondo Band, MD ? ?Admit date: 05/13/2021 ?Discharge date: 05/18/2021 ? ?Time spent: 58mins, more than 50% time spent on coordination of care.  ? ?Recommendations for Outpatient Follow-up:  ?F/u with SNF PCP for hospital discharge follow up, repeat cbc/bmp at follow up ?F/u with urology in  two weeks for foley removal/voiding trial  ?F/u with urology for  renal stone, enlarged prostate, chronic bladder outlet obstruction ?Please monitor nutrition intake/calorie count ?Aspiration precaution /speech to continue monitor patient's swallow function  ?Palliative care to follow patient at snf ? ? ? ?Discharge Diagnoses:  ?Active Hospital Problems  ? Diagnosis Date Noted  ? Sepsis due to undetermined organism ([place]) 05/13/2021  ? Pressure injury of skin 05/16/2021  ? AKI (acute kidney injury) ([place]) 05/13/2021  ? Hypernatremia 05/13/2021  ? Polyneuropathy due to type 2 diabetes mellitus ([place]) 05/13/2021  ? Type 2 diabetes mellitus with hyperglycemia ([place]) 05/13/2021  ? Acute metabolic encephalopathy [id number]  ? CAD (coronary artery disease) 07/03/2018  ? Hypertension   ? Hypercholesteremia   ?  ?Resolved Hospital Problems  ?No resolved problems to display.  ? ? ?Discharge Condition: stable ? ?Diet recommendation:  ? ?Diet recommendations: Dysphagia 1 (puree);Nectar-thick liquid;Thin liquid (thin ok via provale cup) ?Liquids provided via: No straw;Cup ?Medication Administration: Whole meds with puree ?Supervision: Staff to assist with self feeding;Full supervision/cueing for compensatory strategies ?Compensations: Minimize environmental distractions;Slow rate;Small sips/bites;Lingual sweep for clearance of pocketing (oral suction after meals if indicated) ?Postural Changes and/or Swallow Maneuvers: Seated upright 90 degrees;Upright 30-60 min after meal ? ?Filed Weights  ? 05/13/21 1156 05/13/21 2011 05/15/21 0700  ?Weight: 59 kg 56.6 kg 56.7 kg   ? ? ?History of present illness: ( per admitting MD Dr Olevia Bowens) ?Mitchell Scott is a 81 y.o. male with medical history significant of type 2 diabetes, diabetic polyneuropathy, CAD, history MI, hyperlipidemia, hypertension, liver damage, subdural hematoma, COVID-19 who is sent from his facility due to altered mental status.  At the last time he was seen at baseline at the facility was 3 days ago.  The facility nursing staff stated that he has been hyperglycemic in the 450s and his activity level has decreased substantially.  His initial GCS was 8 in the emergency department.  He is now waking up but unable to provide any meaningful HPI. ?  ?ED course: Initial vital signs were temperature 98.8 ?F, pulse 94, respiration 19, BP 121/74 mmHg O2 sat 100% on room air.  The patient received 1000 mL of LR bolus, vancomycin, cefepime and azithromycin in the emergency department. ?  ?Lab work: CBC showed a white count 22.2 with 84% neutrophils, hemoglobin 15.6 g/dL and platelets 430.  PT was 19.9, INR 1.7 and PTT 23.  Lactic acid is 2.1 and then 1.6 mmol/L.  CMP with a sodium 153, potassium 4.4, chloride 114 and CO2 22 mmol/L.  Anion gap is 17.  Calcium 10.4, glucose 415, BUN 94 and creatinine 2.95 mg/dL.  LFTs were normal except for an albumin of 3.1 g/dL and mildly decreased AST at 11 units/L.  Venous blood gas showed a pH of 7.46, PCO2 of 35 and PO2 of 56 mmHg. ?  ?Imaging: Portable chest radiograph with no active disease.  CT chest/abdomen/pelvis without contrast showed large stool burden in the rectum without evidence of stercoral colitis.  4 mm urolith in the right renal collecting system with no hydronephrosis, moderate size hiatal hernia, prostatomegaly, aortic and coronary artery atherosclerosis.  Please  see images and full radiology report for further details. ?  ? ?Hospital Course:  ?Principal Problem: ?  Sepsis due to undetermined organism [facility]) ?Active Problems: ?  Hypertension ?  Hypercholesteremia ?  Acute  metabolic encephalopathy ?  CAD (coronary artery disease) ?  Polyneuropathy due to type 2 diabetes mellitus ([place]) ?  AKI (acute kidney injury) ([place]) ?  Hypernatremia ?  Type 2 diabetes mellitus with hyperglycemia (HCC) ?  Pressure injury of skin ? ? ?Assessment and Plan: ? ?Acute metabolic encephalopathy /  Ecoli  UTI and Ecoli bacteremia  ?CT head no acute findings ?Blood and urine culture positive for E. coli resistant to Cipro gentamicin and Bactrim, sensitive to rest antibiotic tested ?Was on  cefepime /  metronidazole then  rocephin , now cefazolin, change to oral abx that can be crushed , appreciate pharmacy assistance  ?  ?  ?  ?AKI (acute kidney injury) (HCC)/acute urinary retention Foley insertion on 5/10 ?-4 mm calculus within the right renal collecting system without hydronephrosis shown on CT abdomen pelvis ?-Mild prostatomegaly with suggestion of bladder wall thickening,suggesting possible chronic bladder outlet obstruction.  ?-metformin held in the hospital ?-Cr now normal ?-Increase Flomax, start proscar ?-failed voiding trial , foley reinserted on 5/14,  discharge with foley, voiding trial in two weeks ?-recommend urology outpatient follow up ?  ?  ?  ? Hypernatremia, likely from poor oral intake PTA ?-corrected sodium is 163 mmol/L on admision ?-Normalized after hydration .  Continue to be at risk of hypernatremia due to poor oral intake ?-He has dysphagia however he does not like thickened liquid, difficult to follow instruction due to mental debility ?  ?Hypokalemia/hypomagnesemia ?Replaced and improved  ?  ?  ? Type 2 diabetes mellitus with hyperglycemia ([place]), uncontrolled with hyperglycemia Polyneuropathy due to type 2 diabetes mellitus ([place]) ?A1c 11.5  ?carbohydrate modified diet. ?Resume metformin, continue insulin ?Snf MD to continue titrate meds ?  ?  Hypercholesteremia ?Continue atorvastatin 10 mg p.o. daily. ?  ?  ?  CAD (coronary artery disease) ?Aortic and coronary artery  atherosclerosis seen on CT scan ?Denies chest pain or sob ?On atorvastatin. ?Not on ASA or beta-blockers. ?  ?  ?Constipation ?Large stool burden's in the rectum seen on CT scan ?Suppository and stool softener, having BM ?  ?Moderately sized hiatal hernia ?Seen on CT scan ?  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 ?  ?H/o mental delay, FTT, progressive weight loss ?Body mass index is 19.01 kg/m?. ?Baseline bed to wheelchair bound, needs assistance with most of his ADLs ?Recommend palliative care to follow patient at snf, brother understand and agrees ?CODE status: DNR ?  ?  ?Skin Assessment: ?I have examined the patient?s skin and I agree with the wound assessment as performed by the wound care RN as outlined below: ?    ?Pressure Injury 05/13/21 Coccyx Medial Stage 2 -  Partial thickness loss of dermis presenting as a shallow open injury with a red, pink wound bed without slough. unattached edges.  Small dime shape nectrotic issue at bottom (Active)  ?05/13/21 2000  ?Location: Coccyx  ?Location Orientation: Medial  ?Staging: Stage 2 -  Partial thickness loss  of dermis presenting as a shallow open injury with a red, pink wound bed without slough.  ?Wound Description (Comments): unattached edges.  Small dime shape nectrotic issue at bottom  ?Present on Admission: Yes  ?Dressing Type None 05/17/21 0846  ?  ?Apply Triple Paste to buttocks and periarea after each incontinent cleaning and at least twice daily. ?  ? ?Discharge Exam: ?BP 116/65 (BP Location: Left Arm)   Pulse 66   Temp 97.9 ?F (36.6 ?C) (Oral)    Resp 16   Ht 5\' 8"  (1.727 m)   Wt 56.7 kg   SpO2 98%   BMI 19.01 kg/m?  ? ?General: NAD, alert and interactive, mental delay, poor dentition  ?Cardiovascular: RRR ?Respiratory: normal respiratory effort  ? ? ? ?Discharge Instr

## 2021-05-18 NOTE — TOC Transition Note (Signed)
Transition of Care (TOC) - CM/SW Discharge Note ? ? ?Patient Details  ?Name: Mitchell Scott ?MRN: [id number] ?Date of Birth: 1940/03/17 ? ?Transition of Care (TOC) CM/SW Contact:  ?Aedon Deason, LCSW ?Phone Number: ?05/18/2021, 1:51 PM ? ? ?Clinical Narrative:    ?Pt medically cleared today for return to [facility].  Pt/ family aware and agreeable.  PTAR called at 1350.  RN to call report to [phone].  No further TOC needs. ? ? ?Final next level of care: Skilled Nursing Facility ?Barriers to Discharge: Barriers Resolved ? ? ?Patient Goals and CMS Choice ?Patient states their goals for this hospitalization and ongoing recovery are:: Oriented to self ?CMS Medicare.gov Compare Post Acute Care list provided to:: Other (Comment Required) (Brother) ?Choice offered to / list presented to : Sibling ? ?Discharge Placement ?  ?           ?Patient chooses bed at: [facility] ?Patient to be transferred to facility by: PTAR ?Name of family member notified: sister-in-law ?Patient and family notified of of transfer: 05/18/21 ? ?Discharge Plan and Services ?  ?  ?           ?DME Arranged: N/A ?DME Agency: NA ?  ?  ?  ?  ?  ?  ?  ?  ? ?Social Determinants of Health (SDOH) Interventions ?  ? ? ?Readmission Risk Interventions ?   ? View : No data to display.  ?  ?  ?  ? ? ? ? ? ?

## 2021-05-18 NOTE — Progress Notes (Signed)
AVS given to PTAR for transport and explained over the phone with a nurse at the receiving facility. Medications and follow up appointments have been explained with the pt's nurse verbalizing understanding. ? ?

## 2021-05-18 NOTE — NC FL2 (Signed)
?[place] MEDICAID FL2 LEVEL OF CARE SCREENING TOOL  ?  ? ?IDENTIFICATION  ?Patient Name: ?Mitchell Scott Birthdate: 02-05-1940 Sex: male Admission Date (Current Location): ?05/13/2021  ?[place] and [place] Number: ? [place] ?[id number] K Facility and Address:  ?[facility],  [address] [place], [place] ?     Provider Number: ?[id number]  ?Attending Physician Name and Address:  ?Florencia Reasons, MD ? Relative Name and Phone Number:  ?  ?   ?Current Level of Care: ?Hospital Recommended Level of Care: ?[place] Prior Approval Number: ?  ? ?Date Approved/Denied: ?  PASRR Number: ?[id number] A ? ?Discharge Plan: ?SNF ?  ? ?Current Diagnoses: ?Patient Active Problem List  ? Diagnosis Date Noted  ? Pressure injury of skin 05/16/2021  ? Sepsis due to undetermined organism ([place]) 05/13/2021  ? Onychomycosis 05/13/2021  ? Polyneuropathy due to type 2 diabetes mellitus ([place]) 05/13/2021  ? AKI (acute kidney injury) ([place]) 05/13/2021  ? Hypernatremia 05/13/2021  ? Type 2 diabetes mellitus with hyperglycemia ([place]) 05/13/2021  ? Dysphagia 10/08/2020  ? Closed fracture of right distal humerus 08/26/2018  ? Fall 07/03/2018  ? Leukocytosis 07/03/2018  ? Acute metabolic encephalopathy [id number]  ? CAD (coronary artery disease) 07/03/2018  ? COVID-19 virus infection 07/03/2018  ? SDH (subdural hematoma) (HCC)   ? Hypertension   ? Hypercholesteremia   ? Diabetes mellitus without complication ([place])   ? Chronic nonalcoholic liver disease [id number]  ? ? ?Orientation RESPIRATION BLADDER Height & Weight   ?  ?Self ? Normal Incontinent, Indwelling catheter Weight: 125 lb (56.7 kg) ?Height:  5\' 8"  (172.7 cm)  ?BEHAVIORAL SYMPTOMS/MOOD NEUROLOGICAL BOWEL NUTRITION STATUS  ?    Incontinent Diet (Dys 1; thin liquids)  ?AMBULATORY STATUS COMMUNICATION OF NEEDS Skin   ?Total Care Verbally PU Stage and Appropriate Care ?  ?PU Stage 2 Dressing: No Dressing (Pressure offloading, skin care qshift) ?  ?    ?     ?      ? ? ?Personal Care Assistance Level of Assistance  ?Bathing, Feeding, Dressing Bathing Assistance: Limited assistance ?Feeding assistance: Limited assistance ?Dressing Assistance: Limited assistance ?   ? ?Functional Limitations Info  ?    ?  ?   ? ? ?SPECIAL CARE FACTORS FREQUENCY  ?PT (By licensed PT)   ?  ?PT Frequency: 3-5x/wk ?  ?  ?  ?  ?   ? ? ?Contractures Contractures Info: Not present  ? ? ?Additional Factors Info  ?Code Status, Allergies, Insulin Sliding Scale Code Status Info: DNR ?Allergies Info: Codeine, Penicillins ?  ?Insulin Sliding Scale Info: see MAR ?  ?   ? ?Current Medications (05/18/2021):  This is the current hospital active medication list ?Current Facility-Administered Medications  ?Medication Dose Route Frequency Provider Last Rate Last Admin  ? acetaminophen (TYLENOL) tablet 650 mg  650 mg Oral Q6H PRN Reubin Milan, MD   650 mg at 05/17/21 2152  ? Or  ? acetaminophen (TYLENOL) suppository 650 mg  650 mg Rectal Q6H PRN Reubin Milan, MD      ? atorvastatin (LIPITOR) tablet 10 mg  10 mg Oral Daily Reubin Milan, MD   10 mg at 05/18/21 [id number]  ? cephALEXin (KEFLEX) 250 MG/5ML suspension 500 mg  500 mg Oral Q6H Florencia Reasons, MD   500 mg at 05/18/21 0501  ? chlorhexidine (PERIDEX) 0.12 % solution 15 mL  15 mL Mouth Rinse BID Florencia Reasons, MD   15 mL at 05/18/21 0806  ?  Chlorhexidine Gluconate Cloth 2 % PADS 6 each  6 each Topical Daily Florencia Reasons, MD   6 each at 05/18/21 0807  ? enoxaparin (LOVENOX) injection 40 mg  40 mg Subcutaneous Q24H Adrian Saran, [place]   40 mg at 05/17/21 2151  ? food thickener (SIMPLYTHICK (NECTAR/LEVEL 2/MILDLY THICK)) 1 packet  1 packet Oral PRN Florencia Reasons, MD      ? insulin aspart (novoLOG) injection 0-9 Units  0-9 Units Subcutaneous TID WC Reubin Milan, MD   2 Units at 05/18/21 [phone]  ? insulin glargine-yfgn (SEMGLEE) injection 3 Units  3 Units Subcutaneous Daily Florencia Reasons, MD   3 Units at 05/17/21 1207  ? MEDLINE mouth rinse  15 mL Mouth Rinse q12n4p  Florencia Reasons, MD   15 mL at 05/17/21 1519  ? ondansetron (ZOFRAN) tablet 4 mg  4 mg Oral Q6H PRN Reubin Milan, MD      ? Or  ? ondansetron [facility]) injection 4 mg  4 mg Intravenous Q6H PRN Reubin Milan, MD      ? polyethylene glycol (MIRALAX / GLYCOLAX) packet 17 g  17 g Oral BID Florencia Reasons, MD   17 g at 05/18/21 [id number]  ? potassium chloride (KLOR-CON) packet 40 mEq  40 mEq Oral Once Florencia Reasons, MD      ? senna-docusate (Senokot-S) tablet 1 tablet  1 tablet Oral BID Florencia Reasons, MD   1 tablet at 05/18/21 0806  ? tamsulosin (FLOMAX) capsule 0.8 mg  0.8 mg Oral QPM Florencia Reasons, MD   0.8 mg at 05/17/21 1735  ? vitamin B-12 (CYANOCOBALAMIN) tablet 500 mcg  500 mcg Oral Daily Reubin Milan, MD   500 mcg at 05/18/21 [id number]  ? Zinc Oxide (TRIPLE PASTE) 12.8 % ointment   Topical BID Florencia Reasons, MD   Given at 05/18/21 0807  ? ? ? ?Discharge Medications: ?Please see discharge summary for a list of discharge medications. ? ?Relevant Imaging Results: ? ?Relevant Lab Results: ? ? ?Additional Information ?SS# [id number] ? ?Avo Schlachter, LCSW ? ? ? ? ?

## 2022-03-06 DEATH — deceased

## 2023-05-09 IMAGING — RF DG SWALLOWING FUNCTION
19 series · 24 of 24 positions shown · non-contrast
Comparison: CT chest 07/06/2018.

CLINICAL DATA: Dysphagia, unspecified type. Coughing.

EXAM:
MODIFIED BARIUM SWALLOW
TECHNIQUE: Different consistencies of barium were administered orally to the
patient by the Speech Pathologist. Imaging of the pharynx was
performed in the lateral projection. The radiologist was present in
the fluoroscopy room for this study, providing personal supervision.
FLUOROSCOPY TIME:  Fluoroscopy Time:  2 minutes, 42 seconds
Radiation Exposure Index (if provided by the fluoroscopic device):
24.3 mGy
Number of Acquired Spot Images: None

[Series 1: cp_standard · 0.34mm/px · 2 of 43 frames shown (1 of 19)]
[frame 7/43]
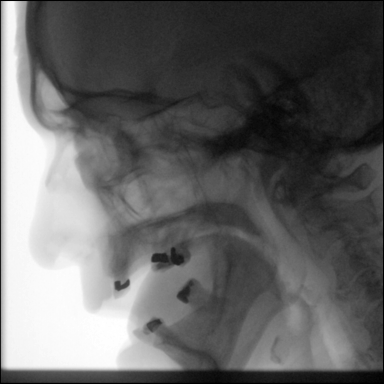
[frame 37/43]
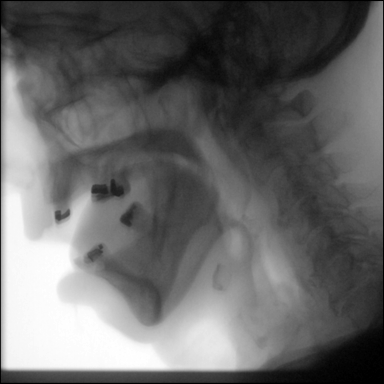

[Series 2: cp_standard · 0.34mm/px · 1 of 54 frames shown (2 of 19)]
[frame 46/54]
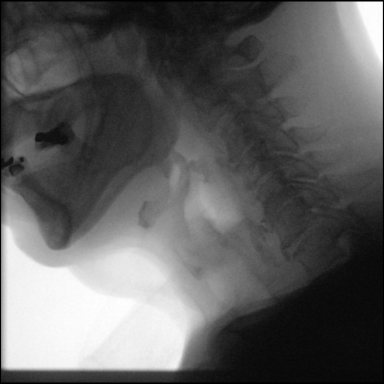

[Series 3: cp_standard · 0.34mm/px · 1 of 31 frames shown (3 of 19)]
[frame 16/31]
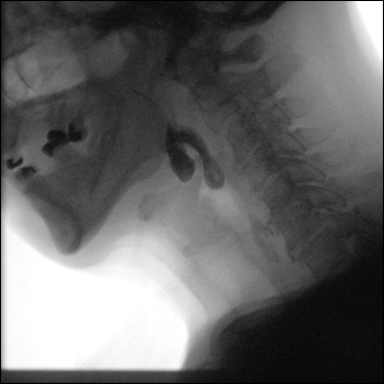

[Series 4: cp_standard · 0.34mm/px · 1 of 189 frames shown (4 of 19)]
[frame 95/189]
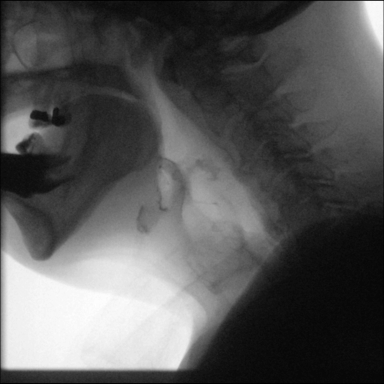

[Series 5: cp_standard · 0.34mm/px · 2 of 54 frames shown (5 of 19)]
[frame 1/54]
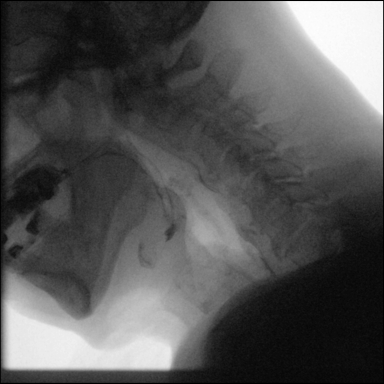
[frame 46/54]
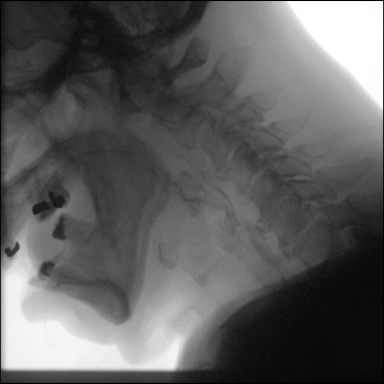

[Series 6: cp_standard · 0.34mm/px · 1 of 209 frames shown (6 of 19)]
[frame 105/209]
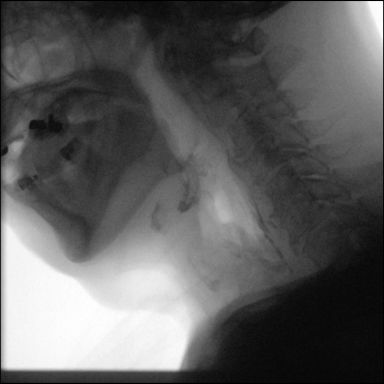

[Series 7: cp_standard · 0.34mm/px · 1 of 28 frames shown (7 of 19)]
[frame 5/28]
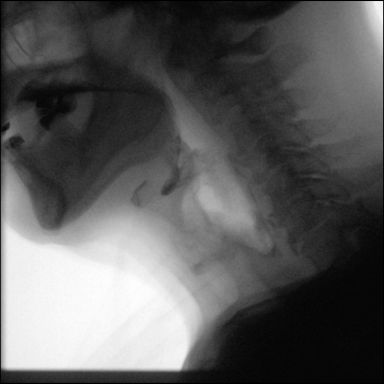

[Series 8: cp_standard · 0.34mm/px · 1 of 138 frames shown (8 of 19)]
[frame 21/138]
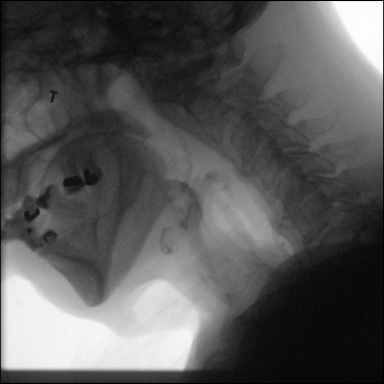

[Series 9: cp_standard · 0.34mm/px · 2 of 65 frames shown (9 of 19)]
[frame 1/65]
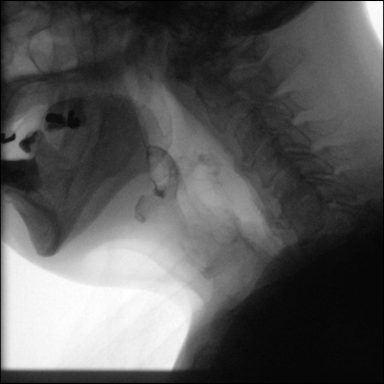
[frame 56/65]
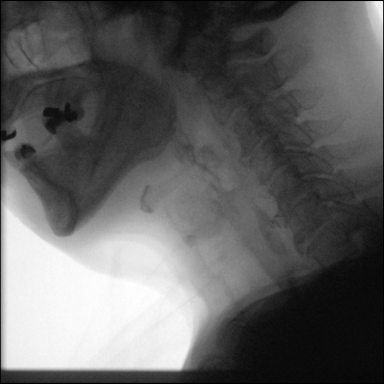

[Series 10: cp_standard · 0.34mm/px · 1 of 71 frames shown (10 of 19)]
[frame 36/71]
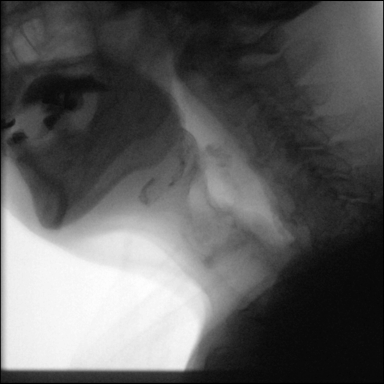

[Series 11: cp_standard · 0.34mm/px · 1 of 32 frames shown (11 of 19)]
[frame 5/32]
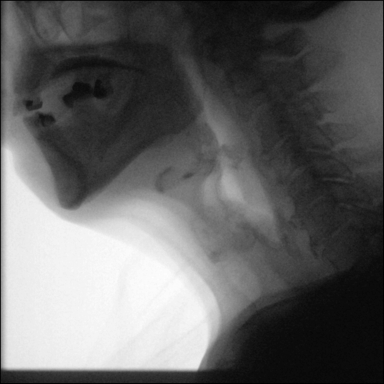

[Series 12: cp_standard · 0.34mm/px · 1 of 58 frames shown (12 of 19)]
[frame 1/58]
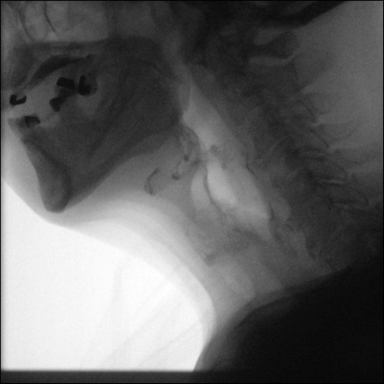

[Series 13: cp_standard · 0.34mm/px · 2 of 137 frames shown (13 of 19)]
[frame 21/137]
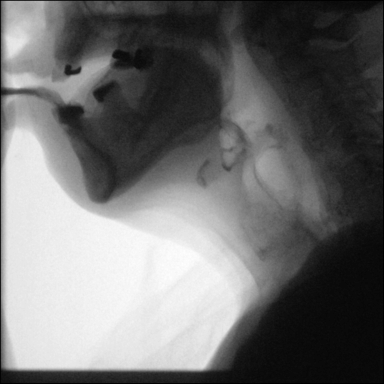
[frame 117/137]
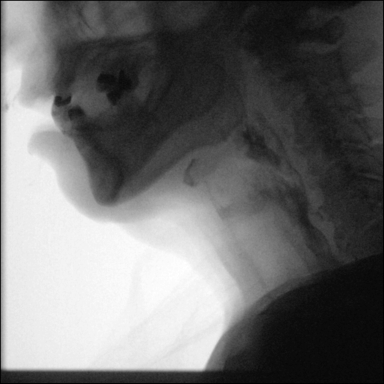

[Series 14: cp_standard · 0.34mm/px · 1 of 211 frames shown (14 of 19)]
[frame 180/211]
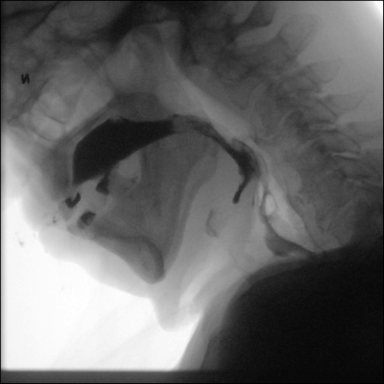

[Series 15: cp_standard · 0.34mm/px · 1 of 48 frames shown (15 of 19)]
[frame 25/48]
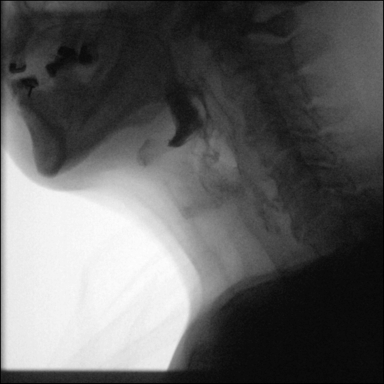

[Series 16: cp_standard · 0.34mm/px · 1 of 208 frames shown (16 of 19)]
[frame 32/208]
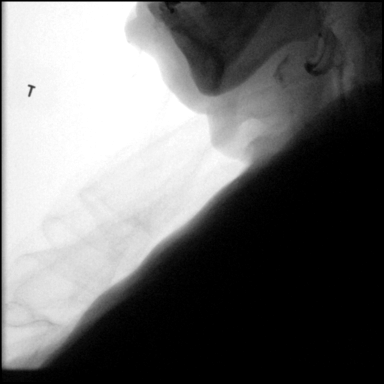

[Series 17: cp_standard · 0.34mm/px · 1 of 207 frames shown (17 of 19)]
[frame 32/207]
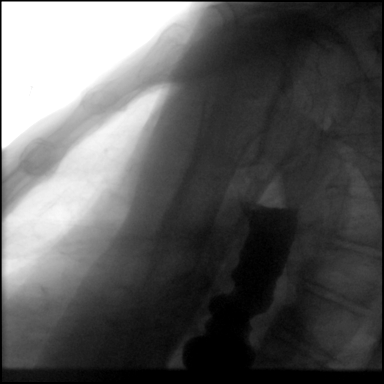

[Series 18: cp_standard · 0.34mm/px · 2 of 64 frames shown (18 of 19)]
[frame 10/64]
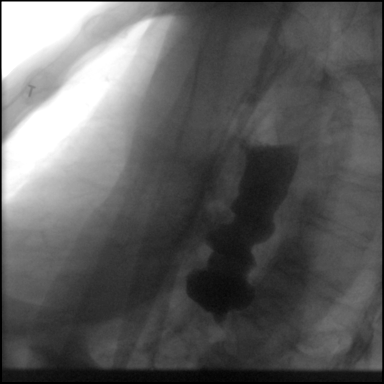
[frame 55/64]
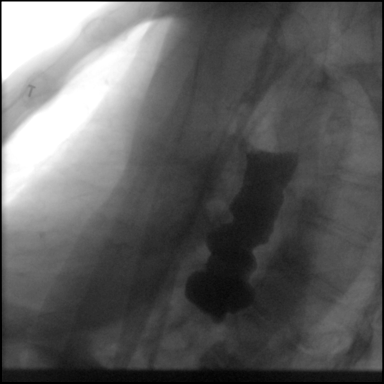

[Series 19: cp_standard · 0.34mm/px · 1 of 11 frames shown (19 of 19)]
[frame 10/11]
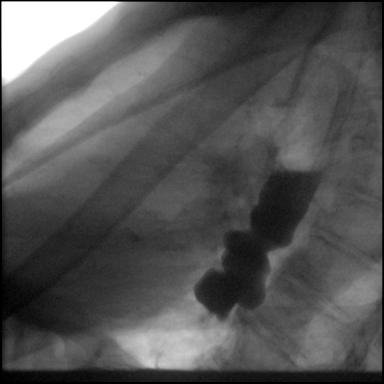

[24 of 24 positions shown; findings below may reference images not displayed]

FINDINGS: Different consistencies of barium were administered orally to the
patient by the Speech Pathologist with fluoroscopic imaging of the
pharynx, as well as limited imaging of the esophagus, from a lateral
projection. The radiologist was present in the fluoroscopy room for
this study, providing personal supervision.

Aspiration was observed with thin consistency.

Limited imaging of the esophagus demonstrates significant esophageal
stasis with a narrowed appearance of the distal esophagus. The
visualized esophagus is also patulous and irregular.

Please refer to the Speech Pathologist's report for additional
details.
IMPRESSION: Modified barium swallow, as described.

Aspiration was observed with thin consistency.

Limited imaging of the esophagus demonstrate significant esophageal
stasis with a narrowed appearance of the distal esophagus. The
visualized esophagus is also patulous and irregular. A dedicated
esophagram with thickened barium is recommended for further
evaluation.

Please refer to the Speech Pathologists report for additional
details and recommendations.
